# Patient Record
Sex: Female | Born: 1950 | Race: White | Hispanic: No | Marital: Married | Smoking: Never smoker
Health system: Southern US, Community
[De-identification: ages and names within clinical notes are randomized; demographics above are authoritative.]

## PROBLEM LIST (undated history)

## (undated) DIAGNOSIS — Z87442 Personal history of urinary calculi: Secondary | ICD-10-CM

## (undated) DIAGNOSIS — Z8739 Personal history of other diseases of the musculoskeletal system and connective tissue: Secondary | ICD-10-CM

## (undated) DIAGNOSIS — Z9889 Other specified postprocedural states: Secondary | ICD-10-CM

## (undated) DIAGNOSIS — M810 Age-related osteoporosis without current pathological fracture: Secondary | ICD-10-CM

## (undated) HISTORY — DX: Age-related osteoporosis without current pathological fracture: M81.0

## (undated) HISTORY — PX: BREAST SURGERY: SHX581

---

## 1969-11-02 DIAGNOSIS — Z9889 Other specified postprocedural states: Secondary | ICD-10-CM

## 1969-11-02 HISTORY — DX: Other specified postprocedural states: Z98.890

## 1998-07-29 ENCOUNTER — Other Ambulatory Visit: Admission: RE | Admit: 1998-07-29 | Discharge: 1998-07-29 | Payer: Self-pay | Admitting: Obstetrics and Gynecology

## 1999-07-23 ENCOUNTER — Other Ambulatory Visit: Admission: RE | Admit: 1999-07-23 | Discharge: 1999-07-23 | Payer: Self-pay | Admitting: Obstetrics and Gynecology

## 2000-09-29 ENCOUNTER — Other Ambulatory Visit: Admission: RE | Admit: 2000-09-29 | Discharge: 2000-09-29 | Payer: Self-pay | Admitting: *Deleted

## 2002-04-12 ENCOUNTER — Encounter: Payer: Self-pay | Admitting: Obstetrics and Gynecology

## 2002-04-12 ENCOUNTER — Ambulatory Visit (HOSPITAL_COMMUNITY): Admission: RE | Admit: 2002-04-12 | Discharge: 2002-04-12 | Payer: Self-pay | Admitting: Obstetrics and Gynecology

## 2002-04-18 ENCOUNTER — Other Ambulatory Visit: Admission: RE | Admit: 2002-04-18 | Discharge: 2002-04-18 | Payer: Self-pay | Admitting: Obstetrics and Gynecology

## 2003-04-17 ENCOUNTER — Encounter: Payer: Self-pay | Admitting: Obstetrics and Gynecology

## 2003-04-17 ENCOUNTER — Other Ambulatory Visit: Admission: RE | Admit: 2003-04-17 | Discharge: 2003-04-17 | Payer: Self-pay | Admitting: Obstetrics and Gynecology

## 2003-04-17 ENCOUNTER — Ambulatory Visit (HOSPITAL_COMMUNITY): Admission: RE | Admit: 2003-04-17 | Discharge: 2003-04-17 | Payer: Self-pay | Admitting: Obstetrics and Gynecology

## 2005-01-15 ENCOUNTER — Ambulatory Visit (HOSPITAL_COMMUNITY): Admission: RE | Admit: 2005-01-15 | Discharge: 2005-01-15 | Payer: Self-pay | Admitting: Obstetrics and Gynecology

## 2005-05-18 ENCOUNTER — Ambulatory Visit (HOSPITAL_COMMUNITY): Admission: RE | Admit: 2005-05-18 | Discharge: 2005-05-18 | Payer: Self-pay | Admitting: Family Medicine

## 2005-05-19 ENCOUNTER — Ambulatory Visit (HOSPITAL_COMMUNITY): Admission: RE | Admit: 2005-05-19 | Discharge: 2005-05-19 | Payer: Self-pay | Admitting: Family Medicine

## 2005-11-30 ENCOUNTER — Ambulatory Visit (HOSPITAL_COMMUNITY): Admission: RE | Admit: 2005-11-30 | Discharge: 2005-11-30 | Payer: Self-pay | Admitting: Internal Medicine

## 2005-11-30 ENCOUNTER — Ambulatory Visit: Payer: Self-pay | Admitting: Internal Medicine

## 2005-11-30 ENCOUNTER — Ambulatory Visit (HOSPITAL_COMMUNITY): Admission: RE | Admit: 2005-11-30 | Discharge: 2005-11-30 | Payer: Self-pay | Admitting: Obstetrics and Gynecology

## 2006-02-02 ENCOUNTER — Ambulatory Visit (HOSPITAL_COMMUNITY): Admission: RE | Admit: 2006-02-02 | Discharge: 2006-02-02 | Payer: Self-pay | Admitting: Obstetrics and Gynecology

## 2007-05-24 ENCOUNTER — Ambulatory Visit (HOSPITAL_COMMUNITY): Admission: RE | Admit: 2007-05-24 | Discharge: 2007-05-24 | Payer: Self-pay | Admitting: Obstetrics and Gynecology

## 2008-05-29 ENCOUNTER — Ambulatory Visit (HOSPITAL_COMMUNITY): Admission: RE | Admit: 2008-05-29 | Discharge: 2008-05-29 | Payer: Self-pay | Admitting: Obstetrics

## 2009-06-03 ENCOUNTER — Ambulatory Visit (HOSPITAL_COMMUNITY): Admission: RE | Admit: 2009-06-03 | Discharge: 2009-06-03 | Payer: Self-pay | Admitting: Obstetrics and Gynecology

## 2009-09-27 ENCOUNTER — Ambulatory Visit (HOSPITAL_COMMUNITY): Admission: RE | Admit: 2009-09-27 | Discharge: 2009-09-27 | Payer: Self-pay | Admitting: Internal Medicine

## 2010-06-05 ENCOUNTER — Ambulatory Visit (HOSPITAL_COMMUNITY): Admission: RE | Admit: 2010-06-05 | Discharge: 2010-06-05 | Payer: Self-pay | Admitting: Internal Medicine

## 2010-06-12 ENCOUNTER — Ambulatory Visit (HOSPITAL_COMMUNITY): Admission: RE | Admit: 2010-06-12 | Discharge: 2010-06-12 | Payer: Self-pay | Admitting: Obstetrics and Gynecology

## 2010-11-23 ENCOUNTER — Encounter: Payer: Self-pay | Admitting: Obstetrics and Gynecology

## 2011-03-20 NOTE — Op Note (Signed)
NAMEMATTIA, Cassandra Berry                  ACCOUNT NO.:  [id number]   MEDICAL RECORD NO.:  [id number]          PATIENT TYPE:  AMB   LOCATION:  DAY                           FACILITY:  [facility]   PHYSICIAN:  R. Roetta Sessions, M.D. DATE OF BIRTH:  02-10-1951   DATE OF PROCEDURE:  11/30/2005  DATE OF DISCHARGE:                                 OPERATIVE REPORT   PROCEDURE:  Screening colonoscopy.   ENDOSCOPIST:  Gerrit Friends. Rourk, M.D.   INDICATIONS FOR PROCEDURE:  The patient is a 60 year old white female  referred for colorectal cancer screening.  Referred by Dr. Richardean Sale  down in [place].  Ms. Biglow is devoid of any lower GI tract symptoms.  There is no family history of colorectal neoplasia, and she has never had  her colon imaged previously.  Colonoscopy is now being done as a standard  screening maneuver.  This approach has been discussed with the patient at  length. The potential risks, benefits, and alternatives have been reviewed;  questions answered.  She is agreeable.  Please see documentation in the  medical record.   PROCEDURE NOTE:  O2 saturation, blood pressure, pulse and respirations were  monitored throughout the entire procedure.   CONSCIOUS SEDATION:  Versed 3 mg IV, Demerol 75 mg IV in divided doses.   INSTRUMENT:  Olympus video chip system.   FINDINGS:  A digital rectal exam revealed no abnormalities.   ENDOSCOPIC FINDINGS:  The prep was excellent.   RECTUM:  Examination of the rectal mucosa including the retroflex view of  the anal verge revealed no abnormalities.   COLON:  The colonic mucosa was surveyed from the rectosigmoid junction  through the left transverse and right colon to the area of the appendiceal  orifice, ileocecal valve, and cecum.  These structures were well seen and  photographed for the record.   From this level the scope was slowly and cautiously withdrawn.  All  previously mentioned mucosal surfaces were again seen.  The colonic mucosa  appeared normal.  The patient tolerated the procedure well and was reacted  in endoscopy.   CECAL WITHDRAWAL TIME:  8 minutes.   IMPRESSION:  1.  Normal rectum.  2.  Normal colon.   RECOMMENDATIONS:  Repeat screening colonoscopy in 10 years.      Jonathon Bellows, M.D.  Electronically Signed     RMR/MEDQ  D:  11/30/2005  T:  11/30/2005  Job:  [id number]   cc:   Richardean Sale, M.D.  Fax: [phone]   Kirk Ruths, M.D.  Fax: [phone]

## 2011-05-01 ENCOUNTER — Other Ambulatory Visit (HOSPITAL_COMMUNITY): Payer: Self-pay | Admitting: Internal Medicine

## 2011-05-01 DIAGNOSIS — M79606 Pain in leg, unspecified: Secondary | ICD-10-CM

## 2011-05-05 ENCOUNTER — Ambulatory Visit (HOSPITAL_COMMUNITY)
Admission: RE | Admit: 2011-05-05 | Discharge: 2011-05-05 | Disposition: A | Discharge status: Home / Self Care | Payer: BC Managed Care – PPO | Source: Ambulatory Visit | Attending: Internal Medicine | Admitting: Internal Medicine

## 2011-05-05 DIAGNOSIS — R229 Localized swelling, mass and lump, unspecified: Secondary | ICD-10-CM | POA: Insufficient documentation

## 2011-05-05 DIAGNOSIS — M79609 Pain in unspecified limb: Secondary | ICD-10-CM | POA: Insufficient documentation

## 2011-05-05 DIAGNOSIS — M79606 Pain in leg, unspecified: Secondary | ICD-10-CM

## 2012-06-07 ENCOUNTER — Other Ambulatory Visit (HOSPITAL_COMMUNITY): Payer: Self-pay | Admitting: Obstetrics and Gynecology

## 2012-06-07 DIAGNOSIS — M810 Age-related osteoporosis without current pathological fracture: Secondary | ICD-10-CM

## 2012-06-10 ENCOUNTER — Ambulatory Visit (HOSPITAL_COMMUNITY)
Admission: RE | Admit: 2012-06-10 | Discharge: 2012-06-10 | Disposition: A | Discharge status: Home / Self Care | Payer: BC Managed Care – PPO | Source: Ambulatory Visit | Attending: Obstetrics and Gynecology | Admitting: Obstetrics and Gynecology

## 2012-06-10 DIAGNOSIS — M810 Age-related osteoporosis without current pathological fracture: Secondary | ICD-10-CM

## 2012-06-10 DIAGNOSIS — Z780 Asymptomatic menopausal state: Secondary | ICD-10-CM | POA: Insufficient documentation

## 2012-06-10 DIAGNOSIS — M818 Other osteoporosis without current pathological fracture: Secondary | ICD-10-CM | POA: Insufficient documentation

## 2012-07-15 ENCOUNTER — Encounter (HOSPITAL_COMMUNITY): Payer: BC Managed Care – PPO | Attending: Internal Medicine

## 2012-07-15 ENCOUNTER — Encounter (HOSPITAL_COMMUNITY): Payer: Self-pay

## 2012-07-15 VITALS — BP 113/72 | HR 69 | Temp 98.8°F | Resp 16

## 2012-07-15 DIAGNOSIS — M810 Age-related osteoporosis without current pathological fracture: Secondary | ICD-10-CM

## 2012-07-15 HISTORY — DX: Age-related osteoporosis without current pathological fracture: M81.0

## 2012-07-15 MED ORDER — SODIUM CHLORIDE 0.9 % IJ SOLN
10.0000 mL | INTRAMUSCULAR | Status: DC | PRN
  Administered 2012-07-15: 10 mL via INTRAVENOUS

## 2012-07-15 MED ORDER — ZOLEDRONIC ACID 5 MG/100ML IV SOLN
5.0000 mg | Freq: Once | INTRAVENOUS | Status: AC
  Administered 2012-07-15: 5 mg via INTRAVENOUS

## 2012-07-15 MED ORDER — SODIUM CHLORIDE 0.9 % IV SOLN
INTRAVENOUS | Status: DC
  Administered 2012-07-15: 15:00:00 via INTRAVENOUS

## 2012-07-15 NOTE — Progress Notes (Signed)
Tolerated reclast well.

## 2013-09-15 ENCOUNTER — Ambulatory Visit (HOSPITAL_COMMUNITY)
Admission: RE | Admit: 2013-09-15 | Discharge: 2013-09-15 | Disposition: A | Discharge status: Home / Self Care | Payer: BC Managed Care – PPO | Source: Ambulatory Visit | Attending: Internal Medicine | Admitting: Internal Medicine

## 2013-09-15 ENCOUNTER — Encounter (HOSPITAL_COMMUNITY): Payer: Self-pay

## 2013-09-15 ENCOUNTER — Other Ambulatory Visit (HOSPITAL_COMMUNITY): Payer: Self-pay | Admitting: Internal Medicine

## 2013-09-15 DIAGNOSIS — M5410 Radiculopathy, site unspecified: Secondary | ICD-10-CM

## 2013-09-15 DIAGNOSIS — M545 Low back pain, unspecified: Secondary | ICD-10-CM | POA: Insufficient documentation

## 2013-09-15 DIAGNOSIS — M5432 Sciatica, left side: Secondary | ICD-10-CM

## 2013-09-15 DIAGNOSIS — M5126 Other intervertebral disc displacement, lumbar region: Secondary | ICD-10-CM | POA: Insufficient documentation

## 2013-09-15 DIAGNOSIS — IMO0002 Reserved for concepts with insufficient information to code with codable children: Secondary | ICD-10-CM | POA: Insufficient documentation

## 2013-11-14 ENCOUNTER — Encounter (HOSPITAL_COMMUNITY)
Admission: RE | Admit: 2013-11-14 | Discharge: 2013-11-14 | Disposition: A | Discharge status: Home / Self Care | Payer: BC Managed Care – PPO | Source: Ambulatory Visit | Attending: Internal Medicine | Admitting: Internal Medicine

## 2013-11-14 DIAGNOSIS — M810 Age-related osteoporosis without current pathological fracture: Secondary | ICD-10-CM | POA: Insufficient documentation

## 2013-11-14 LAB — COMPREHENSIVE METABOLIC PANEL
ALK PHOS: 69 U/L (ref 39–117)
ALT: 12 U/L (ref 0–35)
AST: 21 U/L (ref 0–37)
Albumin: 4 g/dL (ref 3.5–5.2)
BILIRUBIN TOTAL: 0.4 mg/dL (ref 0.3–1.2)
BUN: 27 mg/dL — ABNORMAL HIGH (ref 6–23)
CALCIUM: 10.1 mg/dL (ref 8.4–10.5)
CO2: 31 mEq/L (ref 19–32)
CREATININE: 0.97 mg/dL (ref 0.50–1.10)
Chloride: 103 mEq/L (ref 96–112)
GFR calc non Af Amer: 60 mL/min — ABNORMAL LOW (ref >90)
GFR, EST AFRICAN AMERICAN: 70 mL/min — AB (ref >90)
Glucose, Bld: 83 mg/dL (ref 70–99)
POTASSIUM: 4.6 meq/L (ref 3.7–5.3)
SODIUM: 143 meq/L (ref 137–147)
Total Protein: 6.8 g/dL (ref 6.0–8.3)

## 2013-11-14 LAB — PHOSPHORUS: Phosphorus: 4 mg/dL (ref 2.3–4.6)

## 2013-11-14 LAB — MAGNESIUM: Magnesium: 2.2 mg/dL (ref 1.5–2.5)

## 2013-11-14 NOTE — Progress Notes (Signed)
Results for Cassandra, Berry (MRN [id number]) as of 11/14/2013 12:43  Ref. Range 11/14/2013 10:36  Sodium Latest Range: 137-147 mEq/L 143  Potassium Latest Range: 3.7-5.3 mEq/L 4.6  Chloride Latest Range: 96-112 mEq/L 103  CO2 Latest Range: 19-32 mEq/L 31  BUN Latest Range: 6-23 mg/dL 27 (H)  Creatinine Latest Range: 0.50-1.10 mg/dL 0.97  Calcium Latest Range: 8.4-10.5 mg/dL 10.1  GFR calc non Af Amer Latest Range: >90 mL/min 60 (L)  GFR calc Af Amer Latest Range: >90 mL/min 70 (L)  Glucose Latest Range: 70-99 mg/dL 83  Phosphorus Latest Range: 2.3-4.6 mg/dL 4.0  Magnesium Latest Range: 1.5-2.5 mg/dL 2.2  Alkaline Phosphatase Latest Range: 39-117 U/L 69  Albumin Latest Range: 3.5-5.2 g/dL 4.0  AST Latest Range: 0-37 U/L 21  ALT Latest Range: 0-35 U/L 12  Total Protein Latest Range: 6.0-8.3 g/dL 6.8  Total Bilirubin Latest Range: 0.3-1.2 mg/dL 0.4  Reclast due 11/17/2013

## 2013-11-14 NOTE — Discharge Instructions (Signed)
Osteoporosis Throughout your life, your body breaks down old bone and replaces it with new bone. As you get older, your body does not replace bone as quickly as it breaks it down. By the age of 57 years, most people begin to gradually lose bone because of the imbalance between bone loss and replacement. Some people lose more bone than others. Bone loss beyond a specified normal degree is considered osteoporosis.  Osteoporosis affects the strength and durability of your bones. The inside of the ends of your bones and your flat bones, like the bones of your pelvis, look like honeycomb, filled with tiny open spaces. As bone loss occurs, your bones become less dense. This means that the open spaces inside your bones become bigger and the walls between these spaces become thinner. This makes your bones weaker. Bones of a person with osteoporosis can become so weak that they can break (fracture) during minor accidents, such as a simple fall. CAUSES  The following factors have been associated with the development of osteoporosis:  Smoking.  Drinking more than 2 alcoholic drinks several days per week.  Long-term use of certain medicines:  Corticosteroids.  Chemotherapy medicines.  Thyroid medicines.  Antiepileptic medicines.  Gonadal hormone suppression medicine.  Immunosuppression medicine.  Being underweight.  Lack of physical activity.  Lack of exposure to the sun. This can lead to vitamin D deficiency.  Certain medical conditions:  Certain inflammatory bowel diseases, such as Crohn disease and ulcerative colitis.  Diabetes.  Hyperthyroidism.  Hyperparathyroidism. RISK FACTORS Anyone can develop osteoporosis. However, the following factors can increase your risk of developing osteoporosis:  Gender Women are at higher risk than men.  Age Being older than 38 years increases your risk.  Ethnicity White and Asian people have an increased risk.  Weight Being extremely  underweight can increase your risk of osteoporosis.  Family history of osteoporosis Having a family member who has developed osteoporosis can increase your risk. SYMPTOMS  Usually, people with osteoporosis have no symptoms.  DIAGNOSIS  Signs during a physical exam that may prompt your caregiver to suspect osteoporosis include:  Decreased height. This is usually caused by the compression of the bones that form your spine (vertebrae) because they have weakened and become fractured.  A curving or rounding of the upper back (kyphosis). To confirm signs of osteoporosis, your caregiver may request a procedure that uses 2 low-dose X-ray beams with different levels of energy to measure your bone mineral density (dual-energy X-ray absorptiometry [DXA]). Also, your caregiver may check your level of vitamin D. TREATMENT  The goal of osteoporosis treatment is to strengthen bones in order to decrease the risk of bone fractures. There are different types of medicines available to help achieve this goal. Some of these medicines work by slowing the processes of bone loss. Some medicines work by increasing bone density. Treatment also involves making sure that your levels of calcium and vitamin D are adequate. PREVENTION  There are things you can do to help prevent osteoporosis. Adequate intake of calcium and vitamin D can help you achieve optimal bone mineral density. Regular exercise can also help, especially resistance and weight-bearing activities. If you smoke, quitting smoking is an important part of osteoporosis prevention. MAKE SURE YOU:  Understand these instructions.  Will watch your condition.  Will get help right away if you are not doing well or get worse. FOR MORE INFORMATION www.osteo.org and EquipmentWeekly.com.ee Document Released: 07/29/2005 Document Revised: 02/13/2013 Document Reviewed: 10/03/2011 [facility] Patient Information 2014 [place], [place].  Zoledronic Acid injection (Paget's Disease,  Osteoporosis) What is this medicine? ZOLEDRONIC ACID (ZOE le dron ik AS id) lowers the amount of calcium loss from bone. It is used to treat Paget's disease and osteoporosis in women. This medicine may be used for other purposes; ask your health care provider or pharmacist if you have questions. COMMON BRAND NAME(S): Reclast, Zometa What should I tell my health care provider before I take this medicine? They need to know if you have any of these conditions: -aspirin-sensitive asthma -cancer, especially if you are receiving medicines used to treat cancer -dental disease or wear dentures -infection -kidney disease -low levels of calcium in the blood -past surgery on the parathyroid gland or intestines -receiving corticosteroids like dexamethasone or prednisone -an unusual or allergic reaction to zoledronic acid, other medicines, foods, dyes, or preservatives -pregnant or trying to get pregnant -breast-feeding How should I use this medicine? This medicine is for infusion into a vein. It is given by a health care professional in a hospital or clinic setting. Talk to your pediatrician regarding the use of this medicine in children. This medicine is not approved for use in children. Overdosage: If you think you have taken too much of this medicine contact a poison control center or emergency room at once. NOTE: This medicine is only for you. Do not share this medicine with others. What if I miss a dose? It is important not to miss your dose. Call your doctor or health care professional if you are unable to keep an appointment. What may interact with this medicine? -certain antibiotics given by injection -NSAIDs, medicines for pain and inflammation, like ibuprofen or naproxen -some diuretics like bumetanide, furosemide -teriparatide This list may not describe all possible interactions. Give your health care provider a list of all the medicines, herbs, non-prescription drugs, or dietary  supplements you use. Also tell them if you smoke, drink alcohol, or use illegal drugs. Some items may interact with your medicine. What should I watch for while using this medicine? Visit your doctor or health care professional for regular checkups. It may be some time before you see the benefit from this medicine. Do not stop taking your medicine unless your doctor tells you to. Your doctor may order blood tests or other tests to see how you are doing. Women should inform their doctor if they wish to become pregnant or think they might be pregnant. There is a potential for serious side effects to an unborn child. Talk to your health care professional or pharmacist for more information. You should make sure that you get enough calcium and vitamin D while you are taking this medicine. Discuss the foods you eat and the vitamins you take with your health care professional. Some people who take this medicine have severe bone, joint, and/or muscle pain. This medicine may also increase your risk for jaw problems or a broken thigh bone. Tell your doctor right away if you have severe pain in your jaw, bones, joints, or muscles. Tell your doctor if you have any pain that does not go away or that gets worse. Tell your dentist and dental surgeon that you are taking this medicine. You should not have major dental surgery while on this medicine. See your dentist to have a dental exam and fix any dental problems before starting this medicine. Take good care of your teeth while on this medicine. Make sure you see your dentist for regular follow-up appointments. What side effects may I notice from receiving this  medicine? Side effects that you should report to your doctor or health care professional as soon as possible: -allergic reactions like skin rash, itching or hives, swelling of the face, lips, or tongue -anxiety, confusion, or depression -breathing problems -changes in vision -eye pain -feeling faint or  lightheaded, falls -jaw pain, especially after dental work -mouth sores -muscle cramps, stiffness, or weakness -trouble passing urine or change in the amount of urine Side effects that usually do not require medical attention (report to your doctor or health care professional if they continue or are bothersome): -bone, joint, or muscle pain -constipation -diarrhea -fever -hair loss -irritation at site where injected -loss of appetite -nausea, vomiting -stomach upset -trouble sleeping -trouble swallowing -weak or tired This list may not describe all possible side effects. Call your doctor for medical advice about side effects. You may report side effects to FDA at 1-800-FDA-1088. Where should I keep my medicine? This drug is given in a hospital or clinic and will not be stored at home. NOTE: This sheet is a summary. It may not cover all possible information. If you have questions about this medicine, talk to your doctor, pharmacist, or health care provider.  2014, Elsevier/Gold Standard. (2013-04-03 10:03:48)

## 2013-11-17 ENCOUNTER — Encounter (HOSPITAL_COMMUNITY)
Admission: RE | Admit: 2013-11-17 | Discharge: 2013-11-17 | Disposition: A | Discharge status: Home / Self Care | Payer: BC Managed Care – PPO | Source: Ambulatory Visit | Attending: Internal Medicine | Admitting: Internal Medicine

## 2013-11-17 MED ORDER — ZOLEDRONIC ACID 5 MG/100ML IV SOLN
INTRAVENOUS | Status: AC
  Filled 2013-11-17: qty 100

## 2013-11-17 MED ORDER — SODIUM CHLORIDE 0.9 % IV SOLN
INTRAVENOUS | Status: DC
  Administered 2013-11-17: 10:00:00 via INTRAVENOUS

## 2013-11-17 MED ORDER — ZOLEDRONIC ACID 5 MG/100ML IV SOLN
5.0000 mg | Freq: Once | INTRAVENOUS | Status: AC
  Administered 2013-11-17: 5 mg via INTRAVENOUS

## 2014-04-05 ENCOUNTER — Other Ambulatory Visit (HOSPITAL_COMMUNITY): Payer: Self-pay | Admitting: Obstetrics and Gynecology

## 2014-04-05 DIAGNOSIS — Z139 Encounter for screening, unspecified: Secondary | ICD-10-CM

## 2014-04-10 ENCOUNTER — Ambulatory Visit (HOSPITAL_COMMUNITY)
Admission: RE | Admit: 2014-04-10 | Discharge: 2014-04-10 | Disposition: A | Discharge status: Home / Self Care | Payer: BC Managed Care – PPO | Source: Ambulatory Visit | Attending: Obstetrics and Gynecology | Admitting: Obstetrics and Gynecology

## 2014-04-10 DIAGNOSIS — Z1231 Encounter for screening mammogram for malignant neoplasm of breast: Secondary | ICD-10-CM | POA: Insufficient documentation

## 2014-04-10 DIAGNOSIS — Z139 Encounter for screening, unspecified: Secondary | ICD-10-CM

## 2014-04-27 ENCOUNTER — Other Ambulatory Visit (HOSPITAL_COMMUNITY): Payer: Self-pay | Admitting: Obstetrics and Gynecology

## 2014-04-27 DIAGNOSIS — M810 Age-related osteoporosis without current pathological fracture: Secondary | ICD-10-CM

## 2014-05-08 ENCOUNTER — Ambulatory Visit (HOSPITAL_COMMUNITY)
Admission: RE | Admit: 2014-05-08 | Discharge: 2014-05-08 | Disposition: A | Discharge status: Home / Self Care | Payer: BC Managed Care – PPO | Source: Ambulatory Visit | Attending: Obstetrics and Gynecology | Admitting: Obstetrics and Gynecology

## 2014-05-08 DIAGNOSIS — M810 Age-related osteoporosis without current pathological fracture: Secondary | ICD-10-CM

## 2014-11-16 ENCOUNTER — Encounter (HOSPITAL_COMMUNITY)
Admission: RE | Admit: 2014-11-16 | Discharge: 2014-11-16 | Disposition: A | Discharge status: Home / Self Care | Payer: Medicare Other | Source: Ambulatory Visit | Attending: Internal Medicine | Admitting: Internal Medicine

## 2014-11-16 ENCOUNTER — Encounter (HOSPITAL_COMMUNITY): Payer: Self-pay

## 2014-11-16 ENCOUNTER — Encounter (HOSPITAL_COMMUNITY): Payer: BC Managed Care – PPO

## 2014-11-16 DIAGNOSIS — M810 Age-related osteoporosis without current pathological fracture: Secondary | ICD-10-CM | POA: Diagnosis present

## 2014-11-16 HISTORY — DX: Other specified postprocedural states: Z98.890

## 2014-11-16 LAB — COMPREHENSIVE METABOLIC PANEL
ALBUMIN: 4.4 g/dL (ref 3.5–5.2)
ALK PHOS: 70 U/L (ref 39–117)
ALT: 19 U/L (ref 0–35)
AST: 21 U/L (ref 0–37)
Anion gap: 5 (ref 5–15)
BILIRUBIN TOTAL: 0.4 mg/dL (ref 0.3–1.2)
BUN: 25 mg/dL — ABNORMAL HIGH (ref 6–23)
CALCIUM: 9.6 mg/dL (ref 8.4–10.5)
CHLORIDE: 104 meq/L (ref 96–112)
CO2: 29 mmol/L (ref 19–32)
Creatinine, Ser: 0.74 mg/dL (ref 0.50–1.10)
GFR calc non Af Amer: 87 mL/min — ABNORMAL LOW (ref >90)
Glucose, Bld: 104 mg/dL — ABNORMAL HIGH (ref 70–99)
Potassium: 4 mmol/L (ref 3.5–5.1)
SODIUM: 138 mmol/L (ref 135–145)
TOTAL PROTEIN: 7 g/dL (ref 6.0–8.3)

## 2014-11-16 LAB — PHOSPHORUS: Phosphorus: 3.6 mg/dL (ref 2.3–4.6)

## 2014-11-16 LAB — MAGNESIUM: MAGNESIUM: 2.1 mg/dL (ref 1.5–2.5)

## 2014-11-16 MED ORDER — SODIUM CHLORIDE 0.9 % IV SOLN
Freq: Once | INTRAVENOUS | Status: AC
  Administered 2014-11-16: 250 mL via INTRAVENOUS

## 2014-11-16 MED ORDER — ZOLEDRONIC ACID 5 MG/100ML IV SOLN
5.0000 mg | Freq: Once | INTRAVENOUS | Status: AC
  Administered 2014-11-16: 5 mg via INTRAVENOUS

## 2014-11-16 MED ORDER — ZOLEDRONIC ACID 5 MG/100ML IV SOLN
INTRAVENOUS | Status: AC
  Filled 2014-11-16: qty 100

## 2014-11-16 NOTE — Discharge Instructions (Signed)

## 2014-11-16 NOTE — Progress Notes (Signed)
Results for Cassandra Berry, Cassandra Berry (MRN [id number]) as of 11/16/2014 13:49  Labs prior to Reclast infusion. Tolerated well. Appointment made 11/18/2015  Ref. Range 11/16/2014 13:15  Sodium Latest Range: 135-145 mmol/L 138  Potassium Latest Range: 3.5-5.1 mmol/L 4.0  Chloride Latest Range: 96-112 mEq/L 104  CO2 Latest Range: 19-32 mmol/L 29  BUN Latest Range: 6-23 mg/dL 25 (H)  Creatinine Latest Range: 0.50-1.10 mg/dL 0.74  Calcium Latest Range: 8.4-10.5 mg/dL 9.6  GFR calc non Af Amer Latest Range: >90 mL/min 87 (L)  GFR calc Af Amer Latest Range: >90 mL/min >90  Glucose Latest Range: 70-99 mg/dL 104 (H)  Anion gap Latest Range: 5-15  5  Phosphorus Latest Range: 2.3-4.6 mg/dL 3.6  Magnesium Latest Range: 1.5-2.5 mg/dL 2.1  Alkaline Phosphatase Latest Range: 39-117 U/L 70  Albumin Latest Range: 3.5-5.2 g/dL 4.4  AST Latest Range: 0-37 U/L 21  ALT Latest Range: 0-35 U/L 19  Total Protein Latest Range: 6.0-8.3 g/dL 7.0  Total Bilirubin Latest Range: 0.3-1.2 mg/dL 0.4

## 2015-03-15 ENCOUNTER — Other Ambulatory Visit (HOSPITAL_COMMUNITY): Payer: Self-pay | Admitting: Internal Medicine

## 2015-03-15 DIAGNOSIS — R109 Unspecified abdominal pain: Secondary | ICD-10-CM

## 2015-03-20 ENCOUNTER — Other Ambulatory Visit (HOSPITAL_COMMUNITY): Payer: BC Managed Care – PPO

## 2015-03-28 ENCOUNTER — Ambulatory Visit (HOSPITAL_COMMUNITY): Admission: RE | Admit: 2015-03-28 | Payer: Medicare Other | Source: Ambulatory Visit

## 2015-03-29 ENCOUNTER — Ambulatory Visit (HOSPITAL_COMMUNITY)
Admission: RE | Admit: 2015-03-29 | Discharge: 2015-03-29 | Disposition: A | Discharge status: Home / Self Care | Payer: Medicare Other | Source: Ambulatory Visit | Attending: Internal Medicine | Admitting: Internal Medicine

## 2015-03-29 ENCOUNTER — Ambulatory Visit (HOSPITAL_COMMUNITY): Payer: Medicare Other

## 2015-03-29 DIAGNOSIS — Z87442 Personal history of urinary calculi: Secondary | ICD-10-CM | POA: Insufficient documentation

## 2015-03-29 DIAGNOSIS — R109 Unspecified abdominal pain: Secondary | ICD-10-CM | POA: Insufficient documentation

## 2015-04-16 ENCOUNTER — Ambulatory Visit: Payer: Medicare Other | Admitting: Orthopedic Surgery

## 2015-04-22 ENCOUNTER — Encounter: Payer: Self-pay | Admitting: Orthopedic Surgery

## 2015-04-22 ENCOUNTER — Ambulatory Visit (INDEPENDENT_AMBULATORY_CARE_PROVIDER_SITE_OTHER): Payer: Medicare Other

## 2015-04-22 ENCOUNTER — Ambulatory Visit (INDEPENDENT_AMBULATORY_CARE_PROVIDER_SITE_OTHER): Payer: Medicare Other | Admitting: Orthopedic Surgery

## 2015-04-22 VITALS — BP 111/63 | Ht 60.5 in | Wt 120.0 lb

## 2015-04-22 DIAGNOSIS — M25561 Pain in right knee: Secondary | ICD-10-CM

## 2015-04-22 DIAGNOSIS — M1120 Other chondrocalcinosis, unspecified site: Secondary | ICD-10-CM

## 2015-04-22 DIAGNOSIS — M1711 Unilateral primary osteoarthritis, right knee: Secondary | ICD-10-CM

## 2015-04-22 MED ORDER — DICLOFENAC POTASSIUM 50 MG PO TABS: 50.0000 mg | ORAL_TABLET | Freq: Two times a day (BID) | ORAL | Status: DC

## 2015-04-22 NOTE — Progress Notes (Signed)
Patient ID: Cassandra Berry, female   DOB: [birth year], 64 y.o.   MRN: [id number] Patient ID: Cassandra Berry, female   DOB: 12/19/1950, 64 y.o.   MRN: [id number]  Chief Complaint  Patient presents with  . Follow-up    right knee pain and swelling x 6 months, no known injury    HPI [name] is a 64 y.o. female.  This patient complains of intermittent pain in her right knee that comes and goes for the last 6 months. Pain swelling and it comes its 9 out of 10 at sharp it seems to be worse at night. She did get some improvement with diclofenac and ibuprofen and even better with prednisone. She denies any giving way symptoms reports that occasionally may feel a little weak or like it might give out  Night sweats joint pain gait problem stiff joints swollen joints were reported as positive findings under review of systems the other systems were reported and marked as normal   Past Medical History  Diagnosis Date  . Osteoporosis 07/15/2012  . History of lumpectomy of right breast 1971  . Chronic kidney disease     history of kidney stone    History reviewed. Surgical history lumpectomy and 2 pregnancies one in 1975 1 in [place] history of fractures and osteoporosis otherwise normal   No Known Allergies  Current Outpatient Prescriptions  Medication Sig Dispense Refill  . Calcium Carbonate-Vitamin D (CALCIUM + D PO) Take 1 tablet by mouth daily.    . diclofenac (CATAFLAM) 50 MG tablet Take 1 tablet (50 mg total) by mouth 2 (two) times daily. 60 tablet 1  . ferrous sulfate 325 (65 FE) MG tablet Take 325 mg by mouth daily with breakfast.    . glucosamine-chondroitin 500-400 MG tablet Take 1 tablet by mouth daily.    . Multiple Vitamin (MULTIVITAMIN) tablet Take 1 tablet by mouth daily.    . Omega 3-6-9 Fatty Acids (OMEGA 3-6-9 COMPLEX PO) Take 1 capsule by mouth daily.     No current facility-administered medications for this visit.    Review of Systems Review of Systems  See  above  Physical Exam Blood pressure 111/63, height 5' 0.5" (1.537 m), weight 120 lb (54.432 kg).  Physical Exam  Data Reviewed Imaging studies show she has some mild arthritis on the medial side of the knee  Assessment    I don't appreciate a meniscal tear because she has a negative McMurray's no real mechanical symptoms mainly pain is intermittent and is relieved by anti-inflammatory  Encounter Diagnoses  Name Primary?  . Right knee pain Yes  . Chondrocalcinosis   . Primary osteoarthritis of knee, right         Plan    Continue the anti-inflammatory diclofenac, inject the knee for chondrocalcinosis. We scheduled her for 2 month follow-up but if she improves then we can certainly cancel the appointment  Procedure note right knee injection verbal consent was obtained to inject right knee joint  Timeout was completed to confirm the site of injection  The medications used were 40 mg of Depo-Medrol and 1% lidocaine 3 cc  Anesthesia was provided by ethyl chloride and the skin was prepped with alcohol.  After cleaning the skin with alcohol a 20-gauge needle was used to inject the right knee joint. There were no complications. A sterile bandage was applied.         [name] 04/22/2015, 4:08 PM

## 2015-04-22 NOTE — Patient Instructions (Signed)
Joint Injection  Care After  Refer to this sheet in the next few days. These instructions provide you with information on caring for yourself after you have had a joint injection. Your caregiver also may give you more specific instructions. Your treatment has been planned according to current medical practices, but problems sometimes occur. Call your caregiver if you have any problems or questions after your procedure.  After any type of joint injection, it is not uncommon to experience:  · Soreness, swelling, or bruising around the injection site.  · Mild numbness, tingling, or weakness around the injection site caused by the numbing medicine used before or with the injection.  It also is possible to experience the following effects associated with the specific agent after injection:  · Iodine-based contrast agents:  ¨ Allergic reaction (itching, hives, widespread redness, and swelling beyond the injection site).  · Corticosteroids (These effects are rare.):  ¨ Allergic reaction.  ¨ Increased blood sugar levels (If you have diabetes and you notice that your blood sugar levels have increased, notify your caregiver).  ¨ Increased blood pressure levels.  ¨ Mood swings.  · Hyaluronic acid in the use of viscosupplementation.  ¨ Temporary heat or redness.  ¨ Temporary rash and itching.  ¨ Increased fluid accumulation in the injected joint.  These effects all should resolve within a day after your procedure.   HOME CARE INSTRUCTIONS  · Limit yourself to light activity the day of your procedure. Avoid lifting heavy objects, bending, stooping, or twisting.  · Take prescription or over-the-counter pain medication as directed by your caregiver.  · You may apply ice to your injection site to reduce pain and swelling the day of your procedure. Ice may be applied 03-04 times:  ¨ Put ice in a plastic bag.  ¨ Place a towel between your skin and the bag.  ¨ Leave the ice on for no longer than 15-20 minutes each time.  SEEK  IMMEDIATE MEDICAL CARE IF:   · Pain and swelling get worse rather than better or extend beyond the injection site.  · Numbness does not go away.  · Blood or fluid continues to leak from the injection site.  · You have chest pain.  · You have swelling of your face or tongue.  · You have trouble breathing or you become dizzy.  · You develop a fever, chills, or severe tenderness at the injection site that last longer than 1 day.  MAKE SURE YOU:  · Understand these instructions.  · Watch your condition.  · Get help right away if you are not doing well or if you get worse.  Document Released: 07/02/2011 Document Revised: 01/11/2012 Document Reviewed: 07/02/2011  ExitCare® Patient Information ©2015 [employer]. This information is not intended to replace advice given to you by your health care provider. Make sure you discuss any questions you have with your health care provider.

## 2015-05-10 ENCOUNTER — Other Ambulatory Visit (HOSPITAL_COMMUNITY): Payer: Medicare Other

## 2015-05-10 NOTE — Patient Instructions (Signed)
    Cassandra Berry  05/10/2015     Your procedure is scheduled on 05/15/15.  Report to [facility] at 07:00 A.M.  Call this number if you have problems the morning of surgery:  [phone]   Remember:  Do not eat food or drink liquids after midnight.  Take these medicines the morning of surgery with A SIP OF WATER: None   Do not wear jewelry, make-up or nail polish.  Do not wear lotions, powders, or perfumes.  You may wear deodorant.  Do not shave 48 hours prior to surgery.  Men may shave face and neck.  Do not bring valuables to the hospital.  [facility] is not responsible for any belongings or valuables.  Contacts, dentures or bridgework may not be worn into surgery.  Leave your suitcase in the car.  After surgery it may be brought to your room.  For patients admitted to the hospital, discharge time will be determined by your treatment team.  Patients discharged the day of surgery will not be allowed to drive home.   Special instructions:  Shower using CHG (Hibiclens bath) the night before surgery and the morning of surgery.  Please read over the following fact sheets that you were given. Anesthesia Post-op Instructions   PATIENT INSTRUCTIONS POST-ANESTHESIA  IMMEDIATELY FOLLOWING SURGERY:  Do not drive or operate machinery for the first twenty four hours after surgery.  Do not make any important decisions for twenty four hours after surgery or while taking narcotic pain medications or sedatives.  If you develop intractable nausea and vomiting or a severe headache please notify your doctor immediately.  FOLLOW-UP:  Please make an appointment with your surgeon as instructed. You do not need to follow up with anesthesia unless specifically instructed to do so.  WOUND CARE INSTRUCTIONS (if applicable):  Keep a dry clean dressing on the anesthesia/puncture wound site if there is drainage.  Once the wound has quit draining you may leave it open to air.  Generally you should leave the  bandage intact for twenty four hours unless there is drainage.  If the epidural site drains for more than 36-48 hours please call the anesthesia department.  QUESTIONS?:  Please feel free to call your physician or the hospital operator if you have any questions, and they will be happy to assist you.

## 2015-05-13 ENCOUNTER — Encounter (HOSPITAL_COMMUNITY): Payer: Self-pay

## 2015-05-13 ENCOUNTER — Other Ambulatory Visit: Payer: Self-pay

## 2015-05-13 ENCOUNTER — Encounter (HOSPITAL_COMMUNITY)
Admission: RE | Admit: 2015-05-13 | Discharge: 2015-05-13 | Disposition: A | Discharge status: Home / Self Care | Payer: Medicare Other | Source: Ambulatory Visit | Attending: General Surgery | Admitting: General Surgery

## 2015-05-13 DIAGNOSIS — Z87442 Personal history of urinary calculi: Secondary | ICD-10-CM | POA: Diagnosis not present

## 2015-05-13 DIAGNOSIS — R2242 Localized swelling, mass and lump, left lower limb: Secondary | ICD-10-CM | POA: Diagnosis present

## 2015-05-13 DIAGNOSIS — D3613 Benign neoplasm of peripheral nerves and autonomic nervous system of lower limb, including hip: Secondary | ICD-10-CM | POA: Diagnosis not present

## 2015-05-13 HISTORY — DX: Personal history of other diseases of the musculoskeletal system and connective tissue: Z87.39

## 2015-05-13 HISTORY — DX: Personal history of urinary calculi: Z87.442

## 2015-05-13 LAB — CBC WITH DIFFERENTIAL/PLATELET
Basophils Absolute: 0 10*3/uL (ref 0.0–0.1)
Basophils Relative: 1 % (ref 0–1)
Eosinophils Absolute: 0.1 10*3/uL (ref 0.0–0.7)
Eosinophils Relative: 1 % (ref 0–5)
HCT: 34.1 % — ABNORMAL LOW (ref 36.0–46.0)
HEMOGLOBIN: 11.4 g/dL — AB (ref 12.0–15.0)
Lymphocytes Relative: 26 % (ref 12–46)
Lymphs Abs: 1.4 10*3/uL (ref 0.7–4.0)
MCH: 32.1 pg (ref 26.0–34.0)
MCHC: 33.4 g/dL (ref 30.0–36.0)
MCV: 96.1 fL (ref 78.0–100.0)
Monocytes Absolute: 0.2 10*3/uL (ref 0.1–1.0)
Monocytes Relative: 5 % (ref 3–12)
NEUTROS ABS: 3.5 10*3/uL (ref 1.7–7.7)
Neutrophils Relative %: 67 % (ref 43–77)
PLATELETS: 218 10*3/uL (ref 150–400)
RBC: 3.55 MIL/uL — ABNORMAL LOW (ref 3.87–5.11)
RDW: 13.1 % (ref 11.5–15.5)
WBC: 5.2 10*3/uL (ref 4.0–10.5)

## 2015-05-13 LAB — BASIC METABOLIC PANEL
Anion gap: 6 (ref 5–15)
BUN: 25 mg/dL — ABNORMAL HIGH (ref 6–20)
CHLORIDE: 106 mmol/L (ref 101–111)
CO2: 27 mmol/L (ref 22–32)
Calcium: 9.2 mg/dL (ref 8.9–10.3)
Creatinine, Ser: 0.68 mg/dL (ref 0.44–1.00)
GLUCOSE: 100 mg/dL — AB (ref 65–99)
POTASSIUM: 4.2 mmol/L (ref 3.5–5.1)
Sodium: 139 mmol/L (ref 135–145)

## 2015-05-13 NOTE — Pre-Procedure Instructions (Signed)
Patient given information to sign up for my chart at home. 

## 2015-05-14 NOTE — H&P (Signed)
  NTS SOAP Note  Vital Signs:  Vitals as of: 02/15/6062: Systolic 016: Diastolic 70: Heart Rate 80: Temp 97.13F: Height 41ft 2in: Weight 121Lbs 0 Ounces: BMI 22.13  BMI : 22.13 kg/m2  Subjective: This 64 year old female presents for of a soft tissue mass.  Has been present on the left thigh for several years, but recently has increased in size and tender to touch.  Review of Symptoms:  Constitutional:unremarkable   Head:unremarkable Eyes:unremarkable   Nose/Mouth/Throat:unremarkable Cardiovascular:  unremarkable Respiratory:unremarkable Gastrointestinal:  unremarkable   Genitourinary:unremarkable   joint pain Skin:unremarkable Hematolgic/Lymphatic:unremarkable   Allergic/Immunologic:unremarkable   Past Medical History:  Reviewed  Past Medical History  Surgical History: lumpectomy, kidney stones Allergies: nkda Medications: diclofenac   Social History:Reviewed  Social History  Preferred Language: English Race:  White Ethnicity: Not Hispanic / Latino Age: 64 year Marital Status:  M Alcohol: socially   Smoking Status: Never smoker reviewed on 04/16/2015 Functional Status reviewed on 04/16/2015 ------------------------------------------------ Bathing: Normal Cooking: Normal Dressing: Normal Driving: Normal Eating: Normal Managing Meds: Normal Oral Care: Normal Shopping: Normal Toileting: Normal Transferring: Normal Walking: Normal Cognitive Status reviewed on 04/16/2015 ------------------------------------------------ Attention: Normal Decision Making: Normal Language: Normal Memory: Normal Motor: Normal Perception: Normal Problem Solving: Normal Visual and Spatial: Normal   Family History:Reviewed  Family Health History Mother, Living; Stroke (CVA);  Father, Living; Heart disease;     Objective Information: General:Well appearing, well nourished in no distress. 5cm ovoid tender soft tissue mass in left  thigh Heart:RRR, no murmur Lungs:  CTA bilaterally, no wheezes, rhonchi, rales.  Breathing unlabored.  Assessment:Soft tissue neoplasm, left thigh  Diagnoses: 239.2  D49.2 Neoplasm of soft tissue (Neoplasm of unspecified behavior of bone, soft tissue, and skin)  Procedures: 01093 - OFFICE OUTPATIENT NEW 30 MINUTES    Plan:  Will call to schedule excision of soft tissue neoplasm, left thigh.   Patient Education:Alternative treatments to surgery were discussed with patient (and family).  Risks and benefits  of procedure were fully explained to the patient (and family) who gave informed consent. Patient/family questions were addressed.  Follow-up:Pending Surgery

## 2015-05-15 ENCOUNTER — Ambulatory Visit (HOSPITAL_COMMUNITY): Payer: Medicare Other | Admitting: Anesthesiology

## 2015-05-15 ENCOUNTER — Encounter (HOSPITAL_COMMUNITY)
Admission: RE | Disposition: A | Discharge status: Home / Self Care | Payer: Self-pay | Source: Ambulatory Visit | Attending: General Surgery

## 2015-05-15 ENCOUNTER — Ambulatory Visit (HOSPITAL_COMMUNITY)
Admission: RE | Admit: 2015-05-15 | Discharge: 2015-05-15 | Disposition: A | Discharge status: Home / Self Care | Payer: Medicare Other | Source: Ambulatory Visit | Attending: General Surgery | Admitting: General Surgery

## 2015-05-15 DIAGNOSIS — Z87442 Personal history of urinary calculi: Secondary | ICD-10-CM | POA: Insufficient documentation

## 2015-05-15 DIAGNOSIS — D3613 Benign neoplasm of peripheral nerves and autonomic nervous system of lower limb, including hip: Secondary | ICD-10-CM | POA: Diagnosis not present

## 2015-05-15 HISTORY — PX: MASS EXCISION: SHX2000

## 2015-05-15 SURGERY — EXCISION MASS
Anesthesia: General | Site: Thigh | Laterality: Left

## 2015-05-15 MED ORDER — KETOROLAC TROMETHAMINE 30 MG/ML IJ SOLN
30.0000 mg | Freq: Once | INTRAMUSCULAR | Status: AC
  Administered 2015-05-15: 30 mg via INTRAVENOUS
  Filled 2015-05-15: qty 1

## 2015-05-15 MED ORDER — ONDANSETRON HCL 4 MG/2ML IJ SOLN: 4.0000 mg | Freq: Once | INTRAMUSCULAR | Status: DC | PRN

## 2015-05-15 MED ORDER — 0.9 % SODIUM CHLORIDE (POUR BTL) OPTIME
TOPICAL | Status: DC | PRN
  Administered 2015-05-15: 1000 mL

## 2015-05-15 MED ORDER — BUPIVACAINE HCL (PF) 0.5 % IJ SOLN
INTRAMUSCULAR | Status: DC | PRN
  Administered 2015-05-15: 8 mL

## 2015-05-15 MED ORDER — SODIUM CHLORIDE 0.9 % IJ SOLN
INTRAMUSCULAR | Status: AC
  Filled 2015-05-15: qty 10

## 2015-05-15 MED ORDER — CHLORHEXIDINE GLUCONATE 4 % EX LIQD
1.0000 | Freq: Once | CUTANEOUS | Status: DC
Start: 2015-05-15 — End: 2015-05-15

## 2015-05-15 MED ORDER — POVIDONE-IODINE 10 % EX OINT
TOPICAL_OINTMENT | CUTANEOUS | Status: AC
  Filled 2015-05-15: qty 1

## 2015-05-15 MED ORDER — HYDROCODONE-ACETAMINOPHEN 5-325 MG PO TABS: 1.0000 | ORAL_TABLET | ORAL | Status: AC | PRN

## 2015-05-15 MED ORDER — MIDAZOLAM HCL 2 MG/2ML IJ SOLN
INTRAMUSCULAR | Status: AC
  Filled 2015-05-15: qty 2

## 2015-05-15 MED ORDER — LACTATED RINGERS IV SOLN
INTRAVENOUS | Status: DC
  Administered 2015-05-15: 1000 mL via INTRAVENOUS

## 2015-05-15 MED ORDER — LIDOCAINE HCL (CARDIAC) 10 MG/ML IV SOLN
INTRAVENOUS | Status: DC | PRN
  Administered 2015-05-15: 50 mg via INTRAVENOUS

## 2015-05-15 MED ORDER — FENTANYL CITRATE (PF) 100 MCG/2ML IJ SOLN
INTRAMUSCULAR | Status: DC | PRN
  Administered 2015-05-15 (×2): 50 ug via INTRAVENOUS

## 2015-05-15 MED ORDER — FENTANYL CITRATE (PF) 100 MCG/2ML IJ SOLN
25.0000 ug | INTRAMUSCULAR | Status: AC
  Administered 2015-05-15 (×2): 25 ug via INTRAVENOUS

## 2015-05-15 MED ORDER — MIDAZOLAM HCL 2 MG/2ML IJ SOLN
1.0000 mg | INTRAMUSCULAR | Status: DC | PRN
  Administered 2015-05-15: 2 mg via INTRAVENOUS

## 2015-05-15 MED ORDER — BUPIVACAINE HCL (PF) 0.5 % IJ SOLN
INTRAMUSCULAR | Status: AC
  Filled 2015-05-15: qty 30

## 2015-05-15 MED ORDER — FENTANYL CITRATE (PF) 100 MCG/2ML IJ SOLN: 25.0000 ug | INTRAMUSCULAR | Status: DC | PRN

## 2015-05-15 MED ORDER — EPHEDRINE SULFATE 50 MG/ML IJ SOLN
INTRAMUSCULAR | Status: DC | PRN
  Administered 2015-05-15: 5 mg via INTRAVENOUS

## 2015-05-15 MED ORDER — EPHEDRINE SULFATE 50 MG/ML IJ SOLN
INTRAMUSCULAR | Status: AC
  Filled 2015-05-15: qty 1

## 2015-05-15 MED ORDER — ONDANSETRON HCL 4 MG/2ML IJ SOLN
4.0000 mg | Freq: Once | INTRAMUSCULAR | Status: AC
  Administered 2015-05-15: 4 mg via INTRAVENOUS

## 2015-05-15 MED ORDER — FENTANYL CITRATE (PF) 100 MCG/2ML IJ SOLN
INTRAMUSCULAR | Status: AC
  Filled 2015-05-15: qty 2

## 2015-05-15 MED ORDER — PROPOFOL 10 MG/ML IV BOLUS
INTRAVENOUS | Status: DC | PRN
  Administered 2015-05-15: 150 mg via INTRAVENOUS

## 2015-05-15 MED ORDER — PROPOFOL 10 MG/ML IV BOLUS
INTRAVENOUS | Status: AC
  Filled 2015-05-15: qty 20

## 2015-05-15 MED ORDER — FENTANYL CITRATE (PF) 100 MCG/2ML IJ SOLN
INTRAMUSCULAR | Status: AC
Start: 2015-05-15 — End: 2015-05-15
  Filled 2015-05-15: qty 2

## 2015-05-15 MED ORDER — LIDOCAINE HCL (PF) 1 % IJ SOLN
INTRAMUSCULAR | Status: AC
  Filled 2015-05-15: qty 5

## 2015-05-15 MED ORDER — ONDANSETRON HCL 4 MG/2ML IJ SOLN
INTRAMUSCULAR | Status: AC
  Filled 2015-05-15: qty 2

## 2015-05-15 SURGICAL SUPPLY — 28 items
BAG HAMPER (MISCELLANEOUS) ×3 IMPLANT
CHLORAPREP W/TINT 10.5 ML (MISCELLANEOUS) ×3 IMPLANT
CLOTH BEACON ORANGE TIMEOUT ST (SAFETY) ×3 IMPLANT
COVER LIGHT HANDLE STERIS (MISCELLANEOUS) ×6 IMPLANT
DECANTER SPIKE VIAL GLASS SM (MISCELLANEOUS) ×3 IMPLANT
ELECT NDL TIP 2.8 STRL (NEEDLE) IMPLANT
ELECT NEEDLE TIP 2.8 STRL (NEEDLE) IMPLANT
ELECT REM PT RETURN 9FT ADLT (ELECTROSURGICAL) ×3
ELECTRODE REM PT RTRN 9FT ADLT (ELECTROSURGICAL) ×1 IMPLANT
FORMALIN 10 PREFIL 120ML (MISCELLANEOUS) ×3 IMPLANT
GLOVE SURG SS PI 7.5 STRL IVOR (GLOVE) ×3 IMPLANT
GOWN STRL REUS W/ TWL XL LVL3 (GOWN DISPOSABLE) ×1 IMPLANT
GOWN STRL REUS W/TWL LRG LVL3 (GOWN DISPOSABLE) ×6 IMPLANT
GOWN STRL REUS W/TWL XL LVL3 (GOWN DISPOSABLE) ×3
KIT ROOM TURNOVER APOR (KITS) ×3 IMPLANT
LIQUID BAND (GAUZE/BANDAGES/DRESSINGS) ×2 IMPLANT
MANIFOLD NEPTUNE II (INSTRUMENTS) ×3 IMPLANT
NS IRRIG 1000ML POUR BTL (IV SOLUTION) ×3 IMPLANT
PACK MINOR (CUSTOM PROCEDURE TRAY) IMPLANT
PAD ARMBOARD 7.5X6 YLW CONV (MISCELLANEOUS) ×3 IMPLANT
SET BASIN LINEN APH (SET/KITS/TRAYS/PACK) ×3 IMPLANT
SPONGE LAP 4X18 X RAY DECT (DISPOSABLE) ×2 IMPLANT
SUT ETHILON 3 0 FSL (SUTURE) IMPLANT
SUT PROLENE 4 0 PS 2 18 (SUTURE) IMPLANT
SUT VIC AB 3-0 SH 27 (SUTURE) ×6
SUT VIC AB 3-0 SH 27X BRD (SUTURE) IMPLANT
SUT VIC AB 4-0 PS2 27 (SUTURE) ×2 IMPLANT
SYRINGE 10CC LL (SYRINGE) ×3 IMPLANT

## 2015-05-15 NOTE — Anesthesia Preprocedure Evaluation (Signed)
Anesthesia Evaluation  Patient identified by MRN, date of birth, ID band Patient awake    Reviewed: Allergy & Precautions, NPO status , Patient's Chart, lab work & pertinent test results  Airway Mallampati: II  TM Distance: >3 FB     Dental  (+) Teeth Intact   Pulmonary neg pulmonary ROS,  breath sounds clear to auscultation        Cardiovascular negative cardio ROS  Rhythm:Regular Rate:Normal     Neuro/Psych    GI/Hepatic negative GI ROS,   Endo/Other    Renal/GU      Musculoskeletal   Abdominal   Peds  Hematology   Anesthesia Other Findings   Reproductive/Obstetrics                             Anesthesia Physical Anesthesia Plan  ASA: I  Anesthesia Plan: General   Post-op Pain Management:    Induction: Intravenous  Airway Management Planned: LMA  Additional Equipment:   Intra-op Plan:   Post-operative Plan: Extubation in OR  Informed Consent: I have reviewed the patients History and Physical, chart, labs and discussed the procedure including the risks, benefits and alternatives for the proposed anesthesia with the patient or authorized representative who has indicated his/her understanding and acceptance.     Plan Discussed with:   Anesthesia Plan Comments:         Anesthesia Quick Evaluation

## 2015-05-15 NOTE — Transfer of Care (Signed)
Immediate Anesthesia Transfer of Care Note  Patient: Cassandra Berry  Procedure(s) Performed: Procedure(s): EXCISION SOFT TISSUE NEOPLASM LEFT THIGH (Left)  Patient Location: PACU  Anesthesia Type:General  Level of Consciousness: awake, alert  and patient cooperative  Airway & Oxygen Therapy: Patient Spontanous Breathing  Post-op Assessment: Report given to RN, Post -op Vital signs reviewed and stable and Patient moving all extremities  Post vital signs: Reviewed and stable    Complications: No apparent anesthesia complications

## 2015-05-15 NOTE — Interval H&P Note (Signed)
History and Physical Interval Note:  05/15/2015 8:26 AM  Cassandra Berry  has presented today for surgery, with the diagnosis of soft tissue neoplasm  The various methods of treatment have been discussed with the patient and family. After consideration of risks, benefits and other options for treatment, the patient has consented to  Procedure(s): EXCISION SOFT TISSUE NEOPLASM OF THIGH - 5 CM (Left) as a surgical intervention .  The patient's history has been reviewed, patient examined, no change in status, stable for surgery.  I have reviewed the patient's chart and labs.  Questions were answered to the patient's satisfaction.     Aviva Signs A

## 2015-05-15 NOTE — Op Note (Signed)
Patient:  Cassandra Berry  DOB:  1951-05-19  MRN:  [id number]   Preop Diagnosis:  Soft tissue neoplasm, left thigh  Postop Diagnosis:  Same  Procedure:  Excision of soft tissue neoplasm, left thigh  Surgeon:  Aviva Signs, M.D.  Anes:  Gen.  Indications:  Patient is a 64 year old white female who presents with a tender subcutaneous soft tissue mass in the upper left thigh anteriorly. Risks and benefits of the procedure including bleeding, infection, and recurrence of the mass were fully explained to the patient, who gave informed consent.  Procedure note:  The patient is placed the supine position. After general anesthesia was administered, the left upper thigh was prepped and draped using usual sterile technique with DuraPrep. Surgical site confirmation was performed.  A transverse incision was made over the mass which measured approximately 4 cm in its greatest diameter. Dissection was taken down to the mass which was in the muscle. The fascia was incised and the mass was removed. Clear fluid was noted within the mass. The mass did not appear to be attached to the muscle. It was sent to pathology further examination. A bleeding was controlled using Bovie electrocautery. The fascia was reapproximated loosely using 3-0 Vicryl interrupted sutures. The subcutaneous layer was reapproximated using 3-0 Vicryl interrupted sutures. The skin was closed using a 4-0 Vicryl subcuticular suture. 0.5% Sensorcaine was instilled into the surrounding wound. Liquiband was applied.  All tape and needle counts were correct at the end of the procedure. Patient was awakened and transferred to PACU in stable condition.  Complications:  None  EBL:  Minimal  Specimen:  Neoplasm, soft tissue, left thigh

## 2015-05-15 NOTE — Discharge Instructions (Signed)
May shower tomorrow.  Use ice pack as needed for swelling.

## 2015-05-15 NOTE — Addendum Note (Signed)
Addendum  created 05/15/15 0945 by Vista Deck, CRNA   Modules edited: Anesthesia Events, Anesthesia Flowsheet, Anesthesia Responsible Staff, Narrator   Narrator:  Narrator: Event Log Edited

## 2015-05-15 NOTE — Progress Notes (Signed)
Awake. Denies pain. Ginger-ale given to drink.

## 2015-05-15 NOTE — Anesthesia Procedure Notes (Signed)
Procedure Name: LMA Insertion Date/Time: 05/15/2015 8:41 AM Performed by: Vista Deck Pre-anesthesia Checklist: Patient identified, Patient being monitored, Emergency Drugs available, Timeout performed and Suction available Patient Re-evaluated:Patient Re-evaluated prior to inductionOxygen Delivery Method: Circle System Utilized Preoxygenation: Pre-oxygenation with 100% oxygen Intubation Type: IV induction Ventilation: Mask ventilation without difficulty LMA: LMA inserted LMA Size: 4.0 Number of attempts: 1 Placement Confirmation: positive ETCO2 and breath sounds checked- equal and bilateral Tube secured with: Tape

## 2015-05-15 NOTE — Anesthesia Postprocedure Evaluation (Signed)
  Anesthesia Post-op Note  Patient: Cassandra Berry  Procedure(s) Performed: Procedure(s): EXCISION SOFT TISSUE NEOPLASM LEFT THIGH (Left)  Patient Location: PACU  Anesthesia Type:General  Level of Consciousness: awake and alert   Airway and Oxygen Therapy: Patient Spontanous Breathing  Post-op Pain: none  Post-op Assessment: Post-op Vital signs reviewed, Patient's Cardiovascular Status Stable, Respiratory Function Stable and Patent Airway              Post-op Vital Signs: Reviewed and stable  Last Vitals:  Filed Vitals:   05/15/15 0930  BP: 113/70  Pulse: 76  Temp:   Resp: 8    Complications: Patient re-intubated

## 2015-05-16 ENCOUNTER — Encounter (HOSPITAL_COMMUNITY): Payer: Self-pay | Admitting: General Surgery

## 2015-06-25 ENCOUNTER — Ambulatory Visit: Payer: Medicare Other | Admitting: Orthopedic Surgery

## 2015-10-22 ENCOUNTER — Telehealth: Payer: Self-pay | Admitting: Internal Medicine

## 2015-10-22 NOTE — Telephone Encounter (Signed)
PATIENT ON January RECALL FOR TCS

## 2015-10-22 NOTE — Telephone Encounter (Signed)
LETTER IN THE MAIL

## 2015-11-18 ENCOUNTER — Encounter (HOSPITAL_COMMUNITY): Admission: RE | Admit: 2015-11-18 | Payer: Medicare Other | Source: Ambulatory Visit

## 2015-11-22 ENCOUNTER — Encounter (HOSPITAL_COMMUNITY)
Admission: RE | Admit: 2015-11-22 | Discharge: 2015-11-22 | Disposition: A | Discharge status: Home / Self Care | Payer: Medicare Other | Source: Ambulatory Visit | Attending: Internal Medicine | Admitting: Internal Medicine

## 2015-11-22 ENCOUNTER — Encounter (HOSPITAL_COMMUNITY): Payer: Self-pay

## 2015-11-22 DIAGNOSIS — M810 Age-related osteoporosis without current pathological fracture: Secondary | ICD-10-CM | POA: Diagnosis present

## 2015-11-22 MED ORDER — ZOLEDRONIC ACID 5 MG/100ML IV SOLN
INTRAVENOUS | Status: AC
Start: 2015-11-22 — End: 2015-11-22
  Filled 2015-11-22: qty 100

## 2015-11-22 MED ORDER — ZOLEDRONIC ACID 5 MG/100ML IV SOLN
5.0000 mg | Freq: Once | INTRAVENOUS | Status: AC
  Administered 2015-11-22: 5 mg via INTRAVENOUS

## 2015-11-22 MED ORDER — SODIUM CHLORIDE 0.9 % IV SOLN
INTRAVENOUS | Status: DC
  Administered 2015-11-22: 13:00:00 via INTRAVENOUS

## 2016-08-12 ENCOUNTER — Other Ambulatory Visit (HOSPITAL_COMMUNITY): Payer: Self-pay | Admitting: Internal Medicine

## 2016-08-12 DIAGNOSIS — Z1231 Encounter for screening mammogram for malignant neoplasm of breast: Secondary | ICD-10-CM

## 2016-08-20 ENCOUNTER — Ambulatory Visit (HOSPITAL_COMMUNITY): Payer: Medicare Other

## 2016-08-27 ENCOUNTER — Ambulatory Visit (HOSPITAL_COMMUNITY)
Admission: RE | Admit: 2016-08-27 | Discharge: 2016-08-27 | Disposition: A | Discharge status: Home / Self Care | Payer: Medicare Other | Source: Ambulatory Visit | Attending: Internal Medicine | Admitting: Internal Medicine

## 2016-08-27 DIAGNOSIS — Z1231 Encounter for screening mammogram for malignant neoplasm of breast: Secondary | ICD-10-CM | POA: Diagnosis present

## 2016-10-07 ENCOUNTER — Ambulatory Visit (INDEPENDENT_AMBULATORY_CARE_PROVIDER_SITE_OTHER): Payer: Medicare Other | Admitting: Orthopaedic Surgery

## 2016-10-07 ENCOUNTER — Ambulatory Visit (INDEPENDENT_AMBULATORY_CARE_PROVIDER_SITE_OTHER): Payer: Medicare Other

## 2016-10-07 VITALS — BP 122/68 | HR 74 | Ht 62.0 in | Wt 122.0 lb

## 2016-10-07 DIAGNOSIS — G8929 Other chronic pain: Secondary | ICD-10-CM

## 2016-10-07 DIAGNOSIS — M25561 Pain in right knee: Secondary | ICD-10-CM | POA: Diagnosis not present

## 2016-10-07 NOTE — Progress Notes (Signed)
   Office Visit Note   Patient: Cassandra Berry           Date of Birth: Sep 24, 1951           MRN: [id number] Visit Date: 10/07/2016              Requested by: Celene Squibb, MD [address] 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 Cassandra Berry is happy with her present regimen of exercises and diclofenac. Discussed the potential implications of the arthritis and what she may expect over time. Not suggest any specific treatment above and beyond what she's presently doing at this point.  Follow-Up Instructions: No Follow-up on file.   Orders:  No orders of the defined types were placed in this encounter.  No orders of the defined types were placed in this encounter.     Procedures: No procedures performed   Clinical Data: No additional findings.   Subjective: Chief Complaint  Patient presents with  . Right Knee - Pain    Pt complaining of Right knee pain, denies injury. No swelling, numbness, tingling, no radiating pain, no LBP.  Cassandra Berry has had a chronic problem with her right knee. She has been diagnosed with osteoarthritis per Dr. Aline Brochure in [place]. She had an injection over a year ago that she did not feel made much of a difference. She has very little compromise of her activities. She does have an occasional swelling and popping. She does take diclofenac at night that he "helps".  Review of Systems   Objective: Vital Signs: There were no vitals taken for this visit.  Physical Exam  Ortho Exam right knee exam demonstrates minimal effusion. There is increased varus with mild medial joint pain. No patella crepitation or lateral joint discomfort. Full extension and flexion about 100. No popliteal pain or calf discomfort. No instability. No swelling distally neurovascular exam is  intact  Specialty Comments:  No specialty comments available.  Imaging: No results found.   PMFS History: Patient Active Problem List   Diagnosis Date Noted  . Osteoporosis 07/15/2012   Past Medical History:  Diagnosis Date  . History of gout   . History of kidney stones   . History of lumpectomy of right breast 1971  . Osteoporosis 07/15/2012    No family history on file.  Past Surgical History:  Procedure Laterality Date  . BREAST SURGERY Right    lumpectomy  . MASS EXCISION Left 05/15/2015   Procedure: EXCISION SOFT TISSUE NEOPLASM LEFT THIGH;  Surgeon: Aviva Signs, MD;  Location: [facility];  Service: General;  Laterality: Left;   Social History   Occupational History  . Not on file.   Social History Main Topics  . Smoking status: Never Smoker  . Smokeless tobacco: Not on file  . Alcohol use Yes     Comment: social  . Drug use: No  . Sexual activity: No

## 2016-11-23 ENCOUNTER — Encounter (HOSPITAL_COMMUNITY)
Admission: RE | Admit: 2016-11-23 | Discharge: 2016-11-23 | Disposition: A | Discharge status: Home / Self Care | Payer: Medicare Other | Source: Ambulatory Visit | Attending: Internal Medicine | Admitting: Internal Medicine

## 2016-11-23 ENCOUNTER — Encounter (HOSPITAL_COMMUNITY): Payer: Self-pay

## 2016-11-23 DIAGNOSIS — M810 Age-related osteoporosis without current pathological fracture: Secondary | ICD-10-CM | POA: Insufficient documentation

## 2016-11-23 MED ORDER — ZOLEDRONIC ACID 5 MG/100ML IV SOLN
5.0000 mg | Freq: Once | INTRAVENOUS | Status: AC
  Administered 2016-11-23: 5 mg via INTRAVENOUS

## 2016-11-23 MED ORDER — ZOLEDRONIC ACID 5 MG/100ML IV SOLN
INTRAVENOUS | Status: AC
  Filled 2016-11-23: qty 100

## 2016-11-23 MED ORDER — SODIUM CHLORIDE 0.9 % IV SOLN
INTRAVENOUS | Status: DC
  Administered 2016-11-23: 12:00:00 via INTRAVENOUS

## 2017-09-14 ENCOUNTER — Emergency Department (HOSPITAL_COMMUNITY)
Admission: EM | Admit: 2017-09-14 | Discharge: 2017-09-14 | Disposition: A | Discharge status: Home / Self Care | Payer: Medicare Other | Attending: Emergency Medicine | Admitting: Emergency Medicine

## 2017-09-14 ENCOUNTER — Emergency Department (HOSPITAL_COMMUNITY): Payer: Medicare Other

## 2017-09-14 DIAGNOSIS — Z79899 Other long term (current) drug therapy: Secondary | ICD-10-CM | POA: Diagnosis not present

## 2017-09-14 DIAGNOSIS — Z87442 Personal history of urinary calculi: Secondary | ICD-10-CM | POA: Insufficient documentation

## 2017-09-14 DIAGNOSIS — N132 Hydronephrosis with renal and ureteral calculous obstruction: Secondary | ICD-10-CM | POA: Diagnosis not present

## 2017-09-14 DIAGNOSIS — N200 Calculus of kidney: Secondary | ICD-10-CM

## 2017-09-14 DIAGNOSIS — M549 Dorsalgia, unspecified: Secondary | ICD-10-CM | POA: Diagnosis present

## 2017-09-14 LAB — URINALYSIS, ROUTINE W REFLEX MICROSCOPIC
BILIRUBIN URINE: NEGATIVE
Bacteria, UA: NONE SEEN
Glucose, UA: NEGATIVE mg/dL
KETONES UR: NEGATIVE mg/dL
NITRITE: NEGATIVE
PROTEIN: 30 mg/dL — AB
SPECIFIC GRAVITY, URINE: 1.019 (ref 1.005–1.030)
pH: 5 (ref 5.0–8.0)

## 2017-09-14 MED ORDER — KETOROLAC TROMETHAMINE 30 MG/ML IJ SOLN
30.0000 mg | Freq: Once | INTRAMUSCULAR | Status: AC
  Administered 2017-09-14: 30 mg via INTRAMUSCULAR
  Filled 2017-09-14: qty 1

## 2017-09-14 MED ORDER — ONDANSETRON HCL 4 MG PO TABS: 4.0000 mg | ORAL_TABLET | Freq: Three times a day (TID) | ORAL | 0 refills | Status: DC | PRN

## 2017-09-14 MED ORDER — TAMSULOSIN HCL 0.4 MG PO CAPS: 0.4000 mg | ORAL_CAPSULE | Freq: Every day | ORAL | 0 refills | Status: DC

## 2017-09-14 MED ORDER — OXYCODONE-ACETAMINOPHEN 5-325 MG PO TABS: 2.0000 | ORAL_TABLET | ORAL | 0 refills | Status: DC | PRN

## 2017-09-14 MED ORDER — TAMSULOSIN HCL 0.4 MG PO CAPS
0.4000 mg | ORAL_CAPSULE | Freq: Once | ORAL | Status: AC
  Administered 2017-09-14: 0.4 mg via ORAL
  Filled 2017-09-14: qty 1

## 2017-09-14 MED ORDER — IBUPROFEN 800 MG PO TABS: 800.0000 mg | ORAL_TABLET | Freq: Three times a day (TID) | ORAL | 0 refills | Status: DC

## 2017-09-14 MED ORDER — OXYCODONE-ACETAMINOPHEN 5-325 MG PO TABS: 1.0000 | ORAL_TABLET | ORAL | 0 refills | Status: DC | PRN

## 2017-09-14 NOTE — ED Provider Notes (Signed)
Emergency Department Provider Note   I have reviewed the triage vital signs and the nursing notes.   HISTORY  Chief Complaint Nephrolithiasis   HPI Cassandra Berry is a 66 y.o. female with a history of kidney stones and gout the presents with an acute onset of left-sided sharp and achy back pain about 30 minutes prior to arrival here similar to previous episodes of kidney stone.  After little bit of pain she started having nausea but no vomiting.  This was similar to her kidney stone 20 years ago so came here for further evaluation.  By the time I evaluated her the patient has no symptoms.  She did receive a Toradol shot prior to coming back.  She does have a rash noted on her chest and this is identified by her is being contact dermatitis and she is currently on prednisone for it.  The nausea has improved, no other GI symptoms.  No trauma.  No other associated modifying symptoms.  Has not tried anything for the pain this time says the Toradol given here.  Past Medical History:  Diagnosis Date  . History of gout   . History of kidney stones   . History of lumpectomy of right breast 1971  . Osteoporosis 07/15/2012    Patient Active Problem List   Diagnosis Date Noted  . Osteoporosis 07/15/2012    Past Surgical History:  Procedure Laterality Date  . BREAST SURGERY Right    lumpectomy    Current Outpatient Rx  . Order #: [id number] Class: Historical Med  . Order #: [id number] Class: Normal  . Order #: [id number] Class: Historical Med  . Order #: [id number] Class: Historical Med  . Order #: [id number] Class: Historical Med  . Order #: [id number] Class: Print  . Order #: [id number] Class: Historical Med  . Order #: [id number] Class: Historical Med  . Order #: [id number] Class: Print  . Order #: [id number] Class: Print  . Order #: [id number] Class: Print  . Order #: [id number] Class: Print  . Order #: [id number] Class: Print  . Order #: [id number] Class: Historical Med    Allergies Patient has no  known allergies.  No family history on file.  Social History Social History   Tobacco Use  . Smoking status: Never Smoker  Substance Use Topics  . Alcohol use: Yes    Comment: social  . Drug use: No    Review of Systems  All other systems negative except as documented in the HPI. All pertinent positives and negatives as reviewed in the HPI. ____________________________________________   PHYSICAL EXAM:  VITAL SIGNS: ED Triage Vitals  Enc Vitals Group     BP 09/14/17 1627 (!) 156/88     Pulse Rate 09/14/17 1627 82     Resp 09/14/17 1627 18     Temp 09/14/17 1627 98 F (36.7 C)     Temp Source 09/14/17 1627 Temporal     SpO2 09/14/17 1627 100 %     Weight 09/14/17 1628 110 lb (49.9 kg)     Height 09/14/17 1628 5\' 1"  (1.549 m)    Constitutional: Alert and oriented. Well appearing and in no acute distress. Eyes: Conjunctivae are normal. PERRL. EOMI. Head: Atraumatic. Nose: No congestion/rhinnorhea. Mouth/Throat: Mucous membranes are moist.  Oropharynx non-erythematous. Neck: No stridor.  No meningeal signs.   Cardiovascular: Normal rate, regular rhythm. Good peripheral circulation. Grossly normal heart sounds.   Respiratory: Normal respiratory effort.  No retractions. Lungs CTAB. Gastrointestinal: Soft and nontender. No distention.  Musculoskeletal: No lower extremity tenderness nor edema. No gross  deformities of extremities. Neurologic:  Normal speech and language. No gross focal neurologic deficits are appreciated.  Skin:  Skin is warm, dry and intact. No rash noted.   ____________________________________________   LABS (all labs ordered are listed, but only abnormal results are displayed)  Labs Reviewed  URINALYSIS, ROUTINE W REFLEX MICROSCOPIC - Abnormal; Notable for the following components:      Result Value   APPearance HAZY (*)    Hgb urine dipstick LARGE (*)    Protein, ur 30 (*)    Leukocytes, UA TRACE (*)    Squamous Epithelial / LPF 0-5 (*)     All other components within normal limits   ____________________________________________  RADIOLOGY  Ct Renal Stone Study  Result Date: 09/14/2017 CLINICAL DATA:  History of kidney stones.  Left-sided flank pain. EXAM: CT ABDOMEN AND PELVIS WITHOUT CONTRAST TECHNIQUE: Multidetector CT imaging of the abdomen and pelvis was performed following the standard protocol without IV contrast. COMPARISON:  03/29/2015 FINDINGS: Lower chest: No acute abnormality. Calcifications within the RCA coronary artery identified. Hepatobiliary: No focal liver abnormality is seen. No gallstones, gallbladder wall thickening, or biliary dilatation. Pancreas: Unremarkable. No pancreatic ductal dilatation or surrounding inflammatory changes. Spleen: Normal in size without focal abnormality. Adrenals/Urinary Tract: The adrenal glands are normal. 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 There is left-sided hydronephrosis. At the left UPJ there is a stone which measures 3 mm in diameter. The urinary bladder appears within normal limits. Stomach/Bowel: Stomach is within normal limits. Appendix is not confidently identified separate from the right lower quadrant bowel loops. No evidence of bowel wall thickening, distention, or inflammatory changes. Vascular/Lymphatic: Aortic atherosclerosis.  No aneurysm. Reproductive: Uterus and bilateral adnexa are unremarkable. Other: No abdominal wall hernia or abnormality. No abdominopelvic ascites. Musculoskeletal: Degenerative disc disease identified within the lumbar spine. No aggressive lytic or sclerotic bone lesions identified. IMPRESSION: 1. Left UPJ calculus is identified measuring 3 mm. This results in left-sided hydronephrosis. 2. Bilateral nephrolithiasis. 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 3. Aortic Atherosclerosis (ICD10-I70.0). Calcification in the RCA coronary artery noted. Electronically Signed   By: Kerby Moors M.D.   On:  09/14/2017 18:29    ____________________________________________   PROCEDURES  Procedure(s) performed:   Procedures   ____________________________________________   INITIAL IMPRESSION / ASSESSMENT AND PLAN / ED COURSE  Pertinent labs & imaging results that were available during my care of the patient were reviewed by me and considered in my medical decision making (see chart for details).  Kidney stone, pain controlled. Tolerating PO. Low suspicion for complication.   Will dc to fu w/ urology prn if symptoms return/not improving within 5-7 days. ____________________________________________  FINAL CLINICAL IMPRESSION(S) / ED DIAGNOSES  Final diagnoses:  Nephrolithiasis     MEDICATIONS GIVEN DURING THIS VISIT:  Medications  tamsulosin (FLOMAX) capsule 0.4 mg (not administered)  ketorolac (TORADOL) 30 MG/ML injection 30 mg (30 mg Intramuscular Given 09/14/17 1729)     NEW OUTPATIENT MEDICATIONS STARTED DURING THIS VISIT:  This SmartLink is deprecated. Use AVSMEDLIST instead to display the medication list for a patient.  Note:  This document was prepared using Dragon voice recognition software and may include unintentional dictation errors.   Merrily Pew, MD 09/14/17 [phone]

## 2017-09-14 NOTE — ED Triage Notes (Signed)
Pt states hx of kidney stone, approx 30 minutes ago began having L sided flank pain with N/. Denies hematuria, dysuria, or V/.

## 2017-09-22 MED FILL — Oxycodone w/ Acetaminophen Tab 5-325 MG: ORAL | Qty: 6 | Status: AC

## 2017-09-22 MED FILL — Ondansetron HCl Tab 4 MG: ORAL | Qty: 4 | Status: AC

## 2017-11-23 ENCOUNTER — Encounter (HOSPITAL_COMMUNITY)
Admission: RE | Admit: 2017-11-23 | Discharge: 2017-11-23 | Disposition: A | Discharge status: Home / Self Care | Payer: Medicare Other | Source: Ambulatory Visit | Attending: Internal Medicine | Admitting: Internal Medicine

## 2017-11-23 ENCOUNTER — Encounter (HOSPITAL_COMMUNITY): Payer: Self-pay

## 2017-11-23 DIAGNOSIS — M810 Age-related osteoporosis without current pathological fracture: Secondary | ICD-10-CM | POA: Insufficient documentation

## 2017-11-23 MED ORDER — ZOLEDRONIC ACID 5 MG/100ML IV SOLN
INTRAVENOUS | Status: AC
  Filled 2017-11-23: qty 100

## 2017-11-23 MED ORDER — ZOLEDRONIC ACID 5 MG/100ML IV SOLN
5.0000 mg | Freq: Once | INTRAVENOUS | Status: AC
  Administered 2017-11-23: 5 mg via INTRAVENOUS

## 2017-11-23 MED ORDER — SODIUM CHLORIDE 0.9 % IV SOLN
INTRAVENOUS | Status: DC
  Administered 2017-11-23: 250 mL via INTRAVENOUS

## 2017-11-23 NOTE — Discharge Instructions (Signed)

## 2017-12-01 ENCOUNTER — Other Ambulatory Visit (HOSPITAL_COMMUNITY): Payer: Self-pay | Admitting: Internal Medicine

## 2017-12-01 DIAGNOSIS — Z780 Asymptomatic menopausal state: Secondary | ICD-10-CM

## 2017-12-06 ENCOUNTER — Ambulatory Visit (HOSPITAL_COMMUNITY)
Admission: RE | Admit: 2017-12-06 | Discharge: 2017-12-06 | Disposition: A | Discharge status: Home / Self Care | Payer: Medicare Other | Source: Ambulatory Visit | Attending: Internal Medicine | Admitting: Internal Medicine

## 2017-12-06 DIAGNOSIS — M818 Other osteoporosis without current pathological fracture: Secondary | ICD-10-CM | POA: Insufficient documentation

## 2017-12-06 DIAGNOSIS — Z780 Asymptomatic menopausal state: Secondary | ICD-10-CM | POA: Insufficient documentation

## 2018-06-21 ENCOUNTER — Other Ambulatory Visit: Payer: Self-pay | Admitting: Internal Medicine

## 2018-06-21 DIAGNOSIS — Z780 Asymptomatic menopausal state: Secondary | ICD-10-CM

## 2018-06-21 DIAGNOSIS — M810 Age-related osteoporosis without current pathological fracture: Secondary | ICD-10-CM

## 2018-07-25 ENCOUNTER — Ambulatory Visit: Payer: Medicare Other

## 2018-08-02 ENCOUNTER — Ambulatory Visit (INDEPENDENT_AMBULATORY_CARE_PROVIDER_SITE_OTHER): Payer: Self-pay

## 2018-08-02 DIAGNOSIS — Z1211 Encounter for screening for malignant neoplasm of colon: Secondary | ICD-10-CM

## 2018-08-02 MED ORDER — NA SULFATE-K SULFATE-MG SULF 17.5-3.13-1.6 GM/177ML PO SOLN: 1.0000 | ORAL | 0 refills | Status: AC

## 2018-08-02 NOTE — Progress Notes (Signed)
Gastroenterology Pre-Procedure Review  Request Date:08/02/18 Requesting Physician: Inetta Fermo previous tcs 11/30/2005- RMR- no polyps  PATIENT REVIEW QUESTIONS: The patient responded to the following health history questions as indicated:    1. Diabetes Melitis: no 2. Joint replacements in the past 12 months: no 3. Major health problems in the past 3 months: no 4. Has an artificial valve or MVP: no 5. Has a defibrillator: no 6. Has been advised in past to take antibiotics in advance of a procedure like teeth cleaning: no 7. Family history of colon cancer: no  8. Alcohol Use: yes (occasionally)  9. History of sleep apnea: no  10. History of coronary artery or other vascular stents placed within the last 12 months: no 11. History of any prior anesthesia complications: no    MEDICATIONS & ALLERGIES:    Patient reports the following regarding taking any blood thinners:   Plavix? no Aspirin? no Coumadin? no Brilinta? no Xarelto? no Eliquis? no Pradaxa? no Savaysa? no Effient? no  Patient confirms/reports the following medications:  Current Outpatient Medications  Medication Sig Dispense Refill  . Calcium Carbonate-Vitamin D (CALCIUM + D PO) Take 1 tablet by mouth daily.    . Cholecalciferol (D-3-5) 5000 units capsule Take 5,000 Units by mouth daily.    . Magnesium 100 MG CAPS Take by mouth.    . RED YEAST RICE EXTRACT PO Take by mouth.    . TURMERIC PO Take 750 mg by mouth daily.    . Zoledronic Acid (RECLAST IV) Inject into the vein. Takes once a year     No current facility-administered medications for this visit.     Patient confirms/reports the following allergies:  No Known Allergies  No orders of the defined types were placed in this encounter.   AUTHORIZATION INFORMATION Primary Insurance: [place] ,  ID #: [id number] Pre-Cert / Josem Kaufmann required: no  SCHEDULE INFORMATION: Procedure has been scheduled as follows:  Date: 10/05/18, Time:10:30  Location: [facility]  Dr.Rourk  This Gastroenterology Pre-Precedure Review Form is being routed to the following provider(s): Walden Field NP

## 2018-08-02 NOTE — Patient Instructions (Addendum)
Cassandra Berry  10/05/1951 MRN: [id number]     Procedure Date: 11/30/18 Time to register: 8:30am Place to register: [facility] Procedure Time: 9:30am Scheduled provider: R. Garfield Cornea, MD    PREPARATION FOR COLONOSCOPY WITH SUPREP BOWEL PREP KIT  Note: Suprep Bowel Prep Kit is a split-dose (2day) regimen. Consumption of BOTH 6-ounce bottles is required for a complete prep.  Please notify us immediately if you are diabetic, take iron supplements, or if you are on Coumadin or any other blood thinners.                                                                                                                                                  2 DAYS BEFORE PROCEDURE:  DATE: 11/28/18  DAY: Monday Begin clear liquid diet AFTER your lunch meal. NO SOLID FOODS after this point.  1 DAY BEFORE PROCEDURE:  DATE: 11/29/18 DAY: Tuesday Continue clear liquids the entire day - NO SOLID FOOD.   At 6:00pm: Complete steps 1 through 4 below, using ONE (1) 6-ounce bottle, before going to bed. Step 1:  Pour ONE (1) 6-ounce bottle of SUPREP liquid into the mixing container.  Step 2:  Add cool drinking water to the 16 ounce line on the container and mix.  Note: Dilute the solution concentrate as directed prior to use. Step 3:  DRINK ALL the liquid in the container. Step 4:  You MUST drink an additional two (2) or more 16 ounce containers of water over the next one (1) hour.   Continue clear liquids.  DAY OF PROCEDURE:   DATE: 11/30/18  DAY: Wednesday If you take medications for your heart, blood pressure, or breathing, you may take these medications.   5 hours before your procedure at :4:30am Step 1:  Pour ONE (1) 6-ounce bottle of SUPREP liquid into the mixing container.  Step 2:  Add cool drinking water to the 16 ounce line on the container and mix.  Note: Dilute the solution concentrate as directed prior to use. Step 3:  DRINK ALL the liquid in the container. Step 4:  You MUST drink  an additional two (2) or more 16 ounce containers of water over the next one (1) hour. You MUST complete the final glass of water at least 3 hours before your colonoscopy.   Nothing by mouth past 6:30am  You may take your morning medications with sip of water unless we have instructed otherwise.    Please see below for Dietary Information.  CLEAR LIQUIDS INCLUDE:  Water Jello (NOT red in color)   Ice Popsicles (NOT red in color)   Tea (sugar ok, no milk/cream) Powdered fruit flavored drinks  Coffee (sugar ok, no milk/cream) Gatorade/ Lemonade/ Kool-Aid  (NOT red in color)   Juice: apple, white grape, white cranberry Soft drinks  Clear bullion, consomme, broth (  fat free beef/chicken/vegetable)  Carbonated beverages (any kind)  Strained chicken noodle soup Hard Candy   Remember: Clear liquids are liquids that will allow you to see your fingers on the other side of a clear glass. Be sure liquids are NOT red in color, and not cloudy, but CLEAR.  DO NOT EAT OR DRINK ANY OF THE FOLLOWING:  Dairy products of any kind   Cranberry juice Tomato juice / V8 juice   Grapefruit juice Orange juice     Red grape juice  Do not eat any solid foods, including such foods as: cereal, oatmeal, yogurt, fruits, vegetables, creamed soups, eggs, bread, crackers, pureed foods in a blender, etc.   HELPFUL HINTS FOR DRINKING PREP SOLUTION:   Make sure prep is extremely cold. Mix and refrigerate the the morning of the prep. You may also put in the freezer.   You may try mixing some Crystal Light or Country Time Lemonade if you prefer. Mix in small amounts; add more if necessary.  Try drinking through a straw  Rinse mouth with water or a mouthwash between glasses, to remove after-taste.  Try sipping on a cold beverage /ice/ popsicles between glasses of prep.  Place a piece of sugar-free hard candy in mouth between glasses.  If you become nauseated, try consuming smaller amounts, or stretch out the time  between glasses. Stop for 30-60 minutes, then slowly start back drinking.     OTHER INSTRUCTIONS  You will need a responsible adult at least 69 years of age to accompany you and drive you home. This person must remain in the waiting room during your procedure. The hospital will cancel your procedure if you do not have a responsible adult with you.   1. Wear loose fitting clothing that is easily removed. 2. Leave jewelry and other valuables at home.  3. Remove all body piercing jewelry and leave at home. 4. Total time from sign-in until discharge is approximately 2-3 hours. 5. You should go home directly after your procedure and rest. You can resume normal activities the day after your procedure. 6. The day of your procedure you should not:  Drive  Make legal decisions  Operate machinery  Drink alcohol  Return to work   You may call the office (Dept: [phone]) before 5:00pm, or page the doctor on call [phone]) after 5:00pm, for further instructions, if necessary.   Insurance Information YOU WILL NEED TO CHECK WITH YOUR INSURANCE COMPANY FOR THE BENEFITS OF COVERAGE YOU HAVE FOR THIS PROCEDURE.  UNFORTUNATELY, NOT ALL INSURANCE COMPANIES HAVE BENEFITS TO COVER ALL OR PART OF THESE TYPES OF PROCEDURES.  IT IS YOUR RESPONSIBILITY TO CHECK YOUR BENEFITS, HOWEVER, WE WILL BE GLAD TO ASSIST YOU WITH ANY CODES YOUR INSURANCE COMPANY MAY NEED.    PLEASE NOTE THAT MOST INSURANCE COMPANIES WILL NOT COVER A SCREENING COLONOSCOPY FOR PEOPLE UNDER THE AGE OF 50  IF YOU HAVE BCBS INSURANCE, YOU MAY HAVE BENEFITS FOR A SCREENING COLONOSCOPY BUT IF POLYPS ARE FOUND THE DIAGNOSIS WILL CHANGE AND THEN YOU MAY HAVE A DEDUCTIBLE THAT WILL NEED TO BE MET. SO PLEASE MAKE SURE YOU CHECK YOUR BENEFITS FOR A SCREENING COLONOSCOPY AS WELL AS A DIAGNOSTIC COLONOSCOPY.

## 2018-08-05 NOTE — Progress Notes (Signed)
Ok to schedule.

## 2018-08-19 ENCOUNTER — Other Ambulatory Visit (HOSPITAL_COMMUNITY): Payer: Self-pay | Admitting: Internal Medicine

## 2018-08-19 DIAGNOSIS — Z1231 Encounter for screening mammogram for malignant neoplasm of breast: Secondary | ICD-10-CM

## 2018-08-31 ENCOUNTER — Ambulatory Visit (HOSPITAL_COMMUNITY)
Admission: RE | Admit: 2018-08-31 | Discharge: 2018-08-31 | Disposition: A | Discharge status: Home / Self Care | Payer: Medicare Other | Source: Ambulatory Visit | Attending: Internal Medicine | Admitting: Internal Medicine

## 2018-08-31 DIAGNOSIS — Z1231 Encounter for screening mammogram for malignant neoplasm of breast: Secondary | ICD-10-CM | POA: Insufficient documentation

## 2018-09-15 ENCOUNTER — Telehealth: Payer: Self-pay | Admitting: Internal Medicine

## 2018-09-15 NOTE — Telephone Encounter (Signed)
Called endo-LM for Hoyle Sauer to move procedure.

## 2018-09-15 NOTE — Telephone Encounter (Signed)
Spoke with the pt and moved tcs date, mailed new instructions.

## 2018-09-15 NOTE — Telephone Encounter (Signed)
Pt is scheduled with [facility] on 12/4 for a colonoscopy and wants to reschedule. I told her that JL would call her this afternoon. [phone]

## 2018-09-15 NOTE — Progress Notes (Signed)
Pt called- needed to reschedule tcs, moved her to 11/30/18 and new instructions mailed to the pt- see phone note.

## 2018-11-23 DIAGNOSIS — E782 Mixed hyperlipidemia: Secondary | ICD-10-CM | POA: Diagnosis not present

## 2018-11-23 DIAGNOSIS — R7301 Impaired fasting glucose: Secondary | ICD-10-CM | POA: Diagnosis not present

## 2018-11-23 DIAGNOSIS — M810 Age-related osteoporosis without current pathological fracture: Secondary | ICD-10-CM | POA: Diagnosis not present

## 2018-11-24 ENCOUNTER — Encounter (HOSPITAL_COMMUNITY): Payer: Self-pay

## 2018-11-24 ENCOUNTER — Ambulatory Visit (HOSPITAL_COMMUNITY)
Admission: RE | Admit: 2018-11-24 | Discharge: 2018-11-24 | Disposition: A | Discharge status: Home / Self Care | Payer: PPO | Source: Ambulatory Visit | Attending: Internal Medicine | Admitting: Internal Medicine

## 2018-11-24 DIAGNOSIS — M810 Age-related osteoporosis without current pathological fracture: Secondary | ICD-10-CM | POA: Insufficient documentation

## 2018-11-24 MED ORDER — ZOLEDRONIC ACID 5 MG/100ML IV SOLN
5.0000 mg | Freq: Once | INTRAVENOUS | Status: AC
  Administered 2018-11-24: 5 mg via INTRAVENOUS

## 2018-11-24 MED ORDER — ZOLEDRONIC ACID 5 MG/100ML IV SOLN
INTRAVENOUS | Status: AC
  Filled 2018-11-24: qty 100

## 2018-11-24 MED ORDER — SODIUM CHLORIDE 0.9 % IV SOLN
Freq: Once | INTRAVENOUS | Status: AC
  Administered 2018-11-24: 250 mL via INTRAVENOUS

## 2018-11-24 NOTE — Discharge Instructions (Signed)

## 2018-11-29 NOTE — Addendum Note (Signed)
Addended by: Claudina Lick on: 11/29/2018 01:13 PM   Modules accepted: Orders, SmartSet

## 2018-11-29 NOTE — Progress Notes (Signed)
Cassandra Berry called- they cannot see the pts orders for this procedure. I have placed the orders again.

## 2018-11-30 ENCOUNTER — Encounter (HOSPITAL_COMMUNITY)
Admission: RE | Disposition: A | Discharge status: Home / Self Care | Payer: Self-pay | Source: Home / Self Care | Attending: Internal Medicine

## 2018-11-30 ENCOUNTER — Other Ambulatory Visit: Payer: Self-pay

## 2018-11-30 ENCOUNTER — Encounter (HOSPITAL_COMMUNITY): Payer: Self-pay | Admitting: *Deleted

## 2018-11-30 ENCOUNTER — Ambulatory Visit (HOSPITAL_COMMUNITY)
Admission: RE | Admit: 2018-11-30 | Discharge: 2018-11-30 | Disposition: A | Discharge status: Home / Self Care | Payer: PPO | Attending: Internal Medicine | Admitting: Internal Medicine

## 2018-11-30 DIAGNOSIS — M810 Age-related osteoporosis without current pathological fracture: Secondary | ICD-10-CM | POA: Insufficient documentation

## 2018-11-30 DIAGNOSIS — Z87442 Personal history of urinary calculi: Secondary | ICD-10-CM | POA: Diagnosis not present

## 2018-11-30 DIAGNOSIS — Z8249 Family history of ischemic heart disease and other diseases of the circulatory system: Secondary | ICD-10-CM | POA: Diagnosis not present

## 2018-11-30 DIAGNOSIS — M109 Gout, unspecified: Secondary | ICD-10-CM | POA: Diagnosis not present

## 2018-11-30 DIAGNOSIS — Z1211 Encounter for screening for malignant neoplasm of colon: Secondary | ICD-10-CM | POA: Diagnosis not present

## 2018-11-30 DIAGNOSIS — Z79899 Other long term (current) drug therapy: Secondary | ICD-10-CM | POA: Insufficient documentation

## 2018-11-30 HISTORY — PX: COLONOSCOPY: SHX5424

## 2018-11-30 SURGERY — COLONOSCOPY
Anesthesia: Moderate Sedation

## 2018-11-30 MED ORDER — ONDANSETRON HCL 4 MG/2ML IJ SOLN
INTRAMUSCULAR | Status: AC
  Filled 2018-11-30: qty 2

## 2018-11-30 MED ORDER — MIDAZOLAM HCL 5 MG/5ML IJ SOLN
INTRAMUSCULAR | Status: AC
  Filled 2018-11-30: qty 10

## 2018-11-30 MED ORDER — STERILE WATER FOR IRRIGATION IR SOLN
Status: DC | PRN
  Administered 2018-11-30: 09:00:00

## 2018-11-30 MED ORDER — MIDAZOLAM HCL 5 MG/5ML IJ SOLN
INTRAMUSCULAR | Status: DC | PRN
  Administered 2018-11-30: 1 mg via INTRAVENOUS
  Administered 2018-11-30: 2 mg via INTRAVENOUS
  Administered 2018-11-30: 1 mg via INTRAVENOUS

## 2018-11-30 MED ORDER — MEPERIDINE HCL 100 MG/ML IJ SOLN
INTRAMUSCULAR | Status: DC | PRN
  Administered 2018-11-30: 25 mg via INTRAVENOUS
  Administered 2018-11-30: 15 mg via INTRAVENOUS

## 2018-11-30 MED ORDER — SODIUM CHLORIDE 0.9 % IV SOLN
INTRAVENOUS | Status: DC
  Administered 2018-11-30: 1000 mL via INTRAVENOUS

## 2018-11-30 MED ORDER — MEPERIDINE HCL 50 MG/ML IJ SOLN
INTRAMUSCULAR | Status: AC
  Filled 2018-11-30: qty 1

## 2018-11-30 MED ORDER — ONDANSETRON HCL 4 MG/2ML IJ SOLN
INTRAMUSCULAR | Status: DC | PRN
  Administered 2018-11-30: 4 mg via INTRAVENOUS

## 2018-11-30 NOTE — Discharge Instructions (Signed)
. °  Colonoscopy Discharge Instructions  Read the instructions outlined below and refer to this sheet in the next few weeks. These discharge instructions provide you with general information on caring for yourself after you leave the hospital. Your doctor may also give you specific instructions. While your treatment has been planned according to the most current medical practices available, unavoidable complications occasionally occur. If you have any problems or questions after discharge, call Dr. Gala Romney at [phone]. ACTIVITY  You may resume your regular activity, but move at a slower pace for the next 24 hours.   Take frequent rest periods for the next 24 hours.   Walking will help get rid of the air and reduce the bloated feeling in your belly (abdomen).   No driving for 24 hours (because of the medicine (anesthesia) used during the test).    Do not sign any important legal documents or operate any machinery for 24 hours (because of the anesthesia used during the test).  NUTRITION  Drink plenty of fluids.   You may resume your normal diet as instructed by your doctor.   Begin with a light meal and progress to your normal diet. Heavy or fried foods are harder to digest and may make you feel sick to your stomach (nauseated).   Avoid alcoholic beverages for 24 hours or as instructed.  MEDICATIONS  You may resume your normal medications unless your doctor tells you otherwise.  WHAT YOU CAN EXPECT TODAY  Some feelings of bloating in the abdomen.   Passage of more gas than usual.   Spotting of blood in your stool or on the toilet paper.  IF YOU HAD POLYPS REMOVED DURING THE COLONOSCOPY:  No aspirin products for 7 days or as instructed.   No alcohol for 7 days or as instructed.   Eat a soft diet for the next 24 hours.  FINDING OUT THE RESULTS OF YOUR TEST Not all test results are available during your visit. If your test results are not back during the visit, make an appointment  with your caregiver to find out the results. Do not assume everything is normal if you have not heard from your caregiver or the medical facility. It is important for you to follow up on all of your test results.  SEEK IMMEDIATE MEDICAL ATTENTION IF:  You have more than a spotting of blood in your stool.   Your belly is swollen (abdominal distention).   You are nauseated or vomiting.   You have a temperature over 101.   You have abdominal pain or discomfort that is severe or gets worse throughout the day.   I do not recommend a future colonoscopy unless new symptoms develop.

## 2018-11-30 NOTE — Op Note (Signed)
[facility] Patient Name: Cassandra Berry Procedure Date: 11/30/2018 9:02 AM MRN: [id number] Date of Birth: 03-04-1951 Attending MD: Norvel Richards , MD CSN: [id number] Age: 68 Admit Type: Outpatient Procedure:                Colonoscopy Indications:              Screening for colorectal malignant neoplasm Providers:                Norvel Richards, MD, Janeece Riggers, RN, Nelma Rothman, Technician Referring MD:              Medicines:                Midazolam 4 mg IV, Meperidine 40 mg IV Complications:            No immediate complications. Estimated Blood Loss:     Estimated blood loss: none. Procedure:                Pre-Anesthesia Assessment:                           - Prior to the procedure, a History and Physical                            was performed, and patient medications and                            allergies were reviewed. The patient's tolerance of                            previous anesthesia was also reviewed. The risks                            and benefits of the procedure and the sedation                            options and risks were discussed with the patient.                            All questions were answered, and informed consent                            was obtained. Prior Anticoagulants: The patient has                            taken no previous anticoagulant or antiplatelet                            agents. ASA Grade Assessment: II - A patient with                            mild systemic disease. After reviewing the risks  and benefits, the patient was deemed in                            satisfactory condition to undergo the procedure.                           After obtaining informed consent, the colonoscope                            was passed under direct vision. Throughout the                            procedure, the patient's blood pressure, pulse, and     oxygen saturations were monitored continuously. The                            CF-[id number] ([id number]) scope was introduced through                            the anus and advanced to the 3 cm into the ileum.                            The colonoscopy was performed without difficulty.                            The patient tolerated the procedure well. The                            quality of the bowel preparation was adequate. Scope In: 9:25:18 AM Scope Out: 9:39:05 AM Scope Withdrawal Time: 0 hours 7 minutes 29 seconds  Total Procedure Duration: 0 hours 13 minutes 47 seconds  Findings:      The perianal and digital rectal examinations were normal.      The entire examined colon appeared normal on direct and retroflexion       views. Impression:               - The entire examined colon is normal on direct and                            retroflexion views.                           - No specimens collected. Moderate Sedation:      Moderate (conscious) sedation was administered by the endoscopy nurse       and supervised by the endoscopist. The following parameters were       monitored: oxygen saturation, heart rate, blood pressure, respiratory       rate, EKG, adequacy of pulmonary ventilation, and response to care.       Total physician intraservice time was 18 minutes. Recommendation:           - Patient has a contact number available for                            emergencies. The signs and symptoms of potential  delayed complications were discussed with the                            patient. Return to normal activities tomorrow.                            Written discharge instructions were provided to the                            patient.                           - Resume previous diet.                           - Continue present medications.                           - No repeat colonoscopy due to age.                           - Return to GI  clinic PRN. Procedure Code(s):        --- Professional ---                           [phone], Colonoscopy, flexible; diagnostic, including                            collection of specimen(s) by brushing or washing,                            when performed (separate procedure)                           G0500, Moderate sedation services provided by the                            same physician or other qualified health care                            professional performing a gastrointestinal                            endoscopic service that sedation supports,                            requiring the presence of an independent trained                            observer to assist in the monitoring of the                            patient's level of consciousness and physiological                            status; initial 15 minutes of intra-service  time;                            patient age 27 years or older (additional time may                            be reported with [phone], as appropriate) Diagnosis Code(s):        --- Professional ---                           Z12.11, Encounter for screening for malignant                            neoplasm of colon CPT copyright 2018 American Medical Association. All rights reserved. The codes documented in this report are preliminary and upon coder review may  be revised to meet current compliance requirements. Cristopher Estimable. Devron Cohick, MD Norvel Richards, MD 11/30/2018 9:49:00 AM This report has been signed electronically. Number of Addenda: 0

## 2018-11-30 NOTE — H&P (Signed)
_0 @   Primary Care Physician:  Celene Squibb, MD Primary Gastroenterologist:  Dr. Gala Romney  Pre-Procedure History & Physical: HPI:  Cassandra Berry is a 68 y.o. female here for screening colonoscopy.  No bowel symptoms.  No family history of colon cancer.  Negative colonoscopy 2007.  Past Medical History:  Diagnosis Date  . History of gout   . History of kidney stones   . History of lumpectomy of right breast 1971  . Osteoporosis 07/15/2012    Past Surgical History:  Procedure Laterality Date  . BREAST SURGERY Right    lumpectomy  . MASS EXCISION Left 05/15/2015   Procedure: EXCISION SOFT TISSUE NEOPLASM LEFT THIGH;  Surgeon: Aviva Signs, MD;  Location: [facility];  Service: General;  Laterality: Left;    Prior to Admission medications   Medication Sig Start Date End Date Taking? Authorizing Provider  Calcium Carb-Cholecalciferol (CALCIUM 600/VITAMIN D3 PO) Take 1 tablet by mouth daily.   Yes [provider]  Cholecalciferol (D-3-5) 5000 units capsule Take 5,000 Units by mouth daily.   Yes [provider]  Coenzyme Q10 (COQ10) 50 MG CAPS Take 50 mg by mouth daily.   Yes [provider]  Magnesium 250 MG TABS Take 250 mg by mouth daily.    Yes [provider]  Na Sulfate-K Sulfate-Mg Sulf (SUPREP BOWEL PREP KIT) 17.5-3.13-1.6 GM/177ML SOLN Take 1 kit by mouth as directed. 08/02/18  Yes Carlis Stable, NP  Red Yeast Rice Extract 600 MG CAPS Take 600 mg by mouth daily.    Yes [provider]  TURMERIC CURCUMIN PO Take 1 capsule by mouth daily.   Yes [provider]  Zoledronic Acid (RECLAST IV) Inject 1 Dose into the vein See admin instructions. Takes once a year    Yes [provider]    Allergies as of 08/02/2018  . (No Known Allergies)    Family History  Problem Relation Age of Onset  . Hypercholesterolemia Mother   . Heart Problems Father   . Hypertension Father   . Hypercholesterolemia Father     Social History    Socioeconomic History  . Marital status: Married    Spouse name: Not on file  . Number of children: Not on file  . Years of education: Not on file  . Highest education level: Not on file  Occupational History  . Not on file  Social Needs  . Financial resource strain: Not on file  . Food insecurity:    Worry: Not on file    Inability: Not on file  . Transportation needs:    Medical: Not on file    Non-medical: Not on file  Tobacco Use  . Smoking status: Never Smoker  . Smokeless tobacco: Never Used  Substance and Sexual Activity  . Alcohol use: Yes    Comment: social  . Drug use: No  . Sexual activity: Never    Birth control/protection: Post-menopausal  Lifestyle  . Physical activity:    Days per week: Not on file    Minutes per session: Not on file  . Stress: Not on file  Relationships  . Social connections:    Talks on phone: Not on file    Gets together: Not on file    Attends religious service: Not on file    Active member of club or organization: Not on file    Attends meetings of clubs or organizations: Not on file    Relationship status: Not on file  .  Intimate partner violence:    Fear of current or ex partner: Not on file    Emotionally abused: Not on file    Physically abused: Not on file    Forced sexual activity: Not on file  Other Topics Concern  . Not on file  Social History Narrative  . Not on file    Review of Systems: See HPI, otherwise negative ROS  Physical Exam: BP 120/63   Pulse 89   Temp (!) 97.3 F (36.3 C) (Oral)   Resp 12   Ht _0  (1.549 m)   Wt 49 kg   SpO2 100%   BMI 20.41 kg/m  General:   Alert,  Well-developed, well-nourished, pleasant and cooperative in NAD Neck:  Supple; no masses or thyromegaly. No significant cervical adenopathy. Lungs:  Clear throughout to auscultation.   No wheezes, crackles, or rhonchi. No acute distress. Heart:  Regular rate and rhythm; no murmurs, clicks, rubs,  or gallops. Abdomen:  Non-distended, normal bowel sounds.  Soft and nontender without appreciable mass or hepatosplenomegaly.  Pulses:  Normal pulses noted. Extremities:  Without clubbing or edema.  Impression/Plan: 68 year old lady here for average her screening colonoscopy.  The risks, benefits, limitations, alternatives and imponderables have been reviewed with the patient. Questions have been answered. All parties are agreeable.      Notice: This dictation was prepared with Dragon dictation along with smaller phrase technology. Any transcriptional errors that result from this process are unintentional and may not be corrected upon review.

## 2018-12-06 ENCOUNTER — Encounter (HOSPITAL_COMMUNITY): Payer: Self-pay | Admitting: Internal Medicine

## 2018-12-12 DIAGNOSIS — R7301 Impaired fasting glucose: Secondary | ICD-10-CM | POA: Diagnosis not present

## 2018-12-12 DIAGNOSIS — M810 Age-related osteoporosis without current pathological fracture: Secondary | ICD-10-CM | POA: Diagnosis not present

## 2018-12-12 DIAGNOSIS — M25569 Pain in unspecified knee: Secondary | ICD-10-CM | POA: Diagnosis not present

## 2018-12-12 DIAGNOSIS — R252 Cramp and spasm: Secondary | ICD-10-CM | POA: Diagnosis not present

## 2018-12-12 DIAGNOSIS — R1310 Dysphagia, unspecified: Secondary | ICD-10-CM | POA: Diagnosis not present

## 2018-12-12 DIAGNOSIS — E782 Mixed hyperlipidemia: Secondary | ICD-10-CM | POA: Diagnosis not present

## 2019-02-25 DIAGNOSIS — Z0000 Encounter for general adult medical examination without abnormal findings: Secondary | ICD-10-CM | POA: Diagnosis not present

## 2019-06-01 ENCOUNTER — Other Ambulatory Visit: Payer: Self-pay

## 2019-08-02 DIAGNOSIS — E782 Mixed hyperlipidemia: Secondary | ICD-10-CM | POA: Diagnosis not present

## 2019-08-02 DIAGNOSIS — R7301 Impaired fasting glucose: Secondary | ICD-10-CM | POA: Diagnosis not present

## 2019-08-09 DIAGNOSIS — M810 Age-related osteoporosis without current pathological fracture: Secondary | ICD-10-CM | POA: Diagnosis not present

## 2019-08-09 DIAGNOSIS — E782 Mixed hyperlipidemia: Secondary | ICD-10-CM | POA: Diagnosis not present

## 2019-08-09 DIAGNOSIS — Z23 Encounter for immunization: Secondary | ICD-10-CM | POA: Diagnosis not present

## 2019-08-09 DIAGNOSIS — R252 Cramp and spasm: Secondary | ICD-10-CM | POA: Diagnosis not present

## 2019-08-09 DIAGNOSIS — R944 Abnormal results of kidney function studies: Secondary | ICD-10-CM | POA: Diagnosis not present

## 2019-08-30 DIAGNOSIS — E782 Mixed hyperlipidemia: Secondary | ICD-10-CM | POA: Diagnosis not present

## 2019-08-30 DIAGNOSIS — Z23 Encounter for immunization: Secondary | ICD-10-CM | POA: Diagnosis not present

## 2019-08-30 DIAGNOSIS — D1739 Benign lipomatous neoplasm of skin and subcutaneous tissue of other sites: Secondary | ICD-10-CM | POA: Diagnosis not present

## 2019-08-30 DIAGNOSIS — M810 Age-related osteoporosis without current pathological fracture: Secondary | ICD-10-CM | POA: Diagnosis not present

## 2019-08-30 DIAGNOSIS — R1310 Dysphagia, unspecified: Secondary | ICD-10-CM | POA: Diagnosis not present

## 2019-08-30 DIAGNOSIS — Z0001 Encounter for general adult medical examination with abnormal findings: Secondary | ICD-10-CM | POA: Diagnosis not present

## 2019-08-30 DIAGNOSIS — Z0000 Encounter for general adult medical examination without abnormal findings: Secondary | ICD-10-CM | POA: Diagnosis not present

## 2019-08-30 DIAGNOSIS — Z681 Body mass index (BMI) 19 or less, adult: Secondary | ICD-10-CM | POA: Diagnosis not present

## 2019-08-30 DIAGNOSIS — G4762 Sleep related leg cramps: Secondary | ICD-10-CM | POA: Diagnosis not present

## 2019-08-30 DIAGNOSIS — M25569 Pain in unspecified knee: Secondary | ICD-10-CM | POA: Diagnosis not present

## 2019-08-30 DIAGNOSIS — R252 Cramp and spasm: Secondary | ICD-10-CM | POA: Diagnosis not present

## 2019-08-30 DIAGNOSIS — R7301 Impaired fasting glucose: Secondary | ICD-10-CM | POA: Diagnosis not present

## 2019-11-27 ENCOUNTER — Other Ambulatory Visit (HOSPITAL_COMMUNITY): Payer: Self-pay | Admitting: *Deleted

## 2019-11-28 ENCOUNTER — Encounter (HOSPITAL_COMMUNITY)
Admission: RE | Admit: 2019-11-28 | Discharge: 2019-11-28 | Disposition: A | Discharge status: Home / Self Care | Payer: PPO | Source: Ambulatory Visit | Attending: Internal Medicine | Admitting: Internal Medicine

## 2019-11-28 ENCOUNTER — Other Ambulatory Visit: Payer: Self-pay

## 2019-11-28 DIAGNOSIS — M810 Age-related osteoporosis without current pathological fracture: Secondary | ICD-10-CM | POA: Diagnosis not present

## 2019-11-28 MED ORDER — ZOLEDRONIC ACID 5 MG/100ML IV SOLN
5.0000 mg | Freq: Once | INTRAVENOUS | Status: AC
  Administered 2019-11-28: 5 mg via INTRAVENOUS

## 2019-11-28 MED ORDER — ZOLEDRONIC ACID 5 MG/100ML IV SOLN
INTRAVENOUS | Status: AC
  Filled 2019-11-28: qty 100

## 2019-11-28 MED ORDER — SODIUM CHLORIDE 0.9 % IV SOLN: INTRAVENOUS | Status: DC

## 2019-11-29 LAB — POCT I-STAT, CHEM 8
BUN: 26 mg/dL — ABNORMAL HIGH (ref 8–23)
Calcium, Ion: 1.27 mmol/L (ref 1.15–1.40)
Chloride: 102 mmol/L (ref 98–111)
Creatinine, Ser: 0.9 mg/dL (ref 0.44–1.00)
Glucose, Bld: 78 mg/dL (ref 70–99)
HCT: 39 % (ref 36.0–46.0)
Hemoglobin: 13.3 g/dL (ref 12.0–15.0)
Potassium: 4.4 mmol/L (ref 3.5–5.1)
Sodium: 140 mmol/L (ref 135–145)
TCO2: 30 mmol/L (ref 22–32)

## 2020-03-22 DIAGNOSIS — M9903 Segmental and somatic dysfunction of lumbar region: Secondary | ICD-10-CM | POA: Diagnosis not present

## 2020-03-22 DIAGNOSIS — M25561 Pain in right knee: Secondary | ICD-10-CM | POA: Diagnosis not present

## 2020-03-22 DIAGNOSIS — M545 Low back pain: Secondary | ICD-10-CM | POA: Diagnosis not present

## 2020-03-22 DIAGNOSIS — M9906 Segmental and somatic dysfunction of lower extremity: Secondary | ICD-10-CM | POA: Diagnosis not present

## 2020-08-09 DIAGNOSIS — M9903 Segmental and somatic dysfunction of lumbar region: Secondary | ICD-10-CM | POA: Diagnosis not present

## 2020-08-09 DIAGNOSIS — M9902 Segmental and somatic dysfunction of thoracic region: Secondary | ICD-10-CM | POA: Diagnosis not present

## 2020-08-09 DIAGNOSIS — M9901 Segmental and somatic dysfunction of cervical region: Secondary | ICD-10-CM | POA: Diagnosis not present

## 2020-08-09 DIAGNOSIS — M5186 Other intervertebral disc disorders, lumbar region: Secondary | ICD-10-CM | POA: Diagnosis not present

## 2020-08-13 DIAGNOSIS — M810 Age-related osteoporosis without current pathological fracture: Secondary | ICD-10-CM | POA: Diagnosis not present

## 2020-08-13 DIAGNOSIS — M25569 Pain in unspecified knee: Secondary | ICD-10-CM | POA: Diagnosis not present

## 2020-08-13 DIAGNOSIS — R1310 Dysphagia, unspecified: Secondary | ICD-10-CM | POA: Diagnosis not present

## 2020-08-13 DIAGNOSIS — G4762 Sleep related leg cramps: Secondary | ICD-10-CM | POA: Diagnosis not present

## 2020-08-13 DIAGNOSIS — Z23 Encounter for immunization: Secondary | ICD-10-CM | POA: Diagnosis not present

## 2020-08-13 DIAGNOSIS — Z0001 Encounter for general adult medical examination with abnormal findings: Secondary | ICD-10-CM | POA: Diagnosis not present

## 2020-08-13 DIAGNOSIS — D1739 Benign lipomatous neoplasm of skin and subcutaneous tissue of other sites: Secondary | ICD-10-CM | POA: Diagnosis not present

## 2020-08-13 DIAGNOSIS — E782 Mixed hyperlipidemia: Secondary | ICD-10-CM | POA: Diagnosis not present

## 2020-08-13 DIAGNOSIS — Z0000 Encounter for general adult medical examination without abnormal findings: Secondary | ICD-10-CM | POA: Diagnosis not present

## 2020-08-13 DIAGNOSIS — R944 Abnormal results of kidney function studies: Secondary | ICD-10-CM | POA: Diagnosis not present

## 2020-08-13 DIAGNOSIS — Z681 Body mass index (BMI) 19 or less, adult: Secondary | ICD-10-CM | POA: Diagnosis not present

## 2020-08-13 DIAGNOSIS — R7301 Impaired fasting glucose: Secondary | ICD-10-CM | POA: Diagnosis not present

## 2020-08-16 ENCOUNTER — Other Ambulatory Visit (HOSPITAL_COMMUNITY): Payer: Self-pay | Admitting: Internal Medicine

## 2020-08-16 DIAGNOSIS — E782 Mixed hyperlipidemia: Secondary | ICD-10-CM | POA: Diagnosis not present

## 2020-08-16 DIAGNOSIS — Z0001 Encounter for general adult medical examination with abnormal findings: Secondary | ICD-10-CM | POA: Diagnosis not present

## 2020-08-16 DIAGNOSIS — Z13820 Encounter for screening for osteoporosis: Secondary | ICD-10-CM

## 2020-08-16 DIAGNOSIS — Z23 Encounter for immunization: Secondary | ICD-10-CM | POA: Diagnosis not present

## 2020-08-16 DIAGNOSIS — M810 Age-related osteoporosis without current pathological fracture: Secondary | ICD-10-CM | POA: Diagnosis not present

## 2020-08-16 DIAGNOSIS — R252 Cramp and spasm: Secondary | ICD-10-CM | POA: Diagnosis not present

## 2020-08-16 DIAGNOSIS — Z0000 Encounter for general adult medical examination without abnormal findings: Secondary | ICD-10-CM | POA: Diagnosis not present

## 2020-11-08 DIAGNOSIS — E782 Mixed hyperlipidemia: Secondary | ICD-10-CM | POA: Diagnosis not present

## 2020-11-08 DIAGNOSIS — M25569 Pain in unspecified knee: Secondary | ICD-10-CM | POA: Diagnosis not present

## 2020-11-08 DIAGNOSIS — Z23 Encounter for immunization: Secondary | ICD-10-CM | POA: Diagnosis not present

## 2020-11-08 DIAGNOSIS — R7301 Impaired fasting glucose: Secondary | ICD-10-CM | POA: Diagnosis not present

## 2020-11-08 DIAGNOSIS — G4762 Sleep related leg cramps: Secondary | ICD-10-CM | POA: Diagnosis not present

## 2020-11-08 DIAGNOSIS — Z0001 Encounter for general adult medical examination with abnormal findings: Secondary | ICD-10-CM | POA: Diagnosis not present

## 2020-11-08 DIAGNOSIS — Z0000 Encounter for general adult medical examination without abnormal findings: Secondary | ICD-10-CM | POA: Diagnosis not present

## 2020-11-08 DIAGNOSIS — M810 Age-related osteoporosis without current pathological fracture: Secondary | ICD-10-CM | POA: Diagnosis not present

## 2020-11-08 DIAGNOSIS — Z681 Body mass index (BMI) 19 or less, adult: Secondary | ICD-10-CM | POA: Diagnosis not present

## 2020-11-08 DIAGNOSIS — D1739 Benign lipomatous neoplasm of skin and subcutaneous tissue of other sites: Secondary | ICD-10-CM | POA: Diagnosis not present

## 2020-11-08 DIAGNOSIS — R1310 Dysphagia, unspecified: Secondary | ICD-10-CM | POA: Diagnosis not present

## 2020-11-08 DIAGNOSIS — R944 Abnormal results of kidney function studies: Secondary | ICD-10-CM | POA: Diagnosis not present

## 2020-11-27 ENCOUNTER — Other Ambulatory Visit (HOSPITAL_COMMUNITY)
Admission: RE | Admit: 2020-11-27 | Discharge: 2020-11-27 | Disposition: A | Discharge status: Home / Self Care | Payer: PPO | Source: Ambulatory Visit | Attending: Internal Medicine | Admitting: Internal Medicine

## 2020-11-27 ENCOUNTER — Encounter (HOSPITAL_COMMUNITY): Payer: Self-pay

## 2020-11-27 ENCOUNTER — Other Ambulatory Visit: Payer: Self-pay

## 2020-11-27 DIAGNOSIS — M810 Age-related osteoporosis without current pathological fracture: Secondary | ICD-10-CM | POA: Diagnosis not present

## 2020-11-27 MED ORDER — ZOLEDRONIC ACID 5 MG/100ML IV SOLN
5.0000 mg | Freq: Once | INTRAVENOUS | Status: AC
  Administered 2020-11-27: 5 mg via INTRAVENOUS

## 2020-11-27 MED ORDER — SODIUM CHLORIDE 0.9 % IV SOLN: Freq: Once | INTRAVENOUS | Status: AC

## 2021-06-11 DIAGNOSIS — D485 Neoplasm of uncertain behavior of skin: Secondary | ICD-10-CM | POA: Diagnosis not present

## 2021-06-11 DIAGNOSIS — L821 Other seborrheic keratosis: Secondary | ICD-10-CM | POA: Diagnosis not present

## 2021-06-11 DIAGNOSIS — D2272 Melanocytic nevi of left lower limb, including hip: Secondary | ICD-10-CM | POA: Diagnosis not present

## 2021-06-11 DIAGNOSIS — D225 Melanocytic nevi of trunk: Secondary | ICD-10-CM | POA: Diagnosis not present

## 2021-06-11 DIAGNOSIS — Z1283 Encounter for screening for malignant neoplasm of skin: Secondary | ICD-10-CM | POA: Diagnosis not present

## 2021-07-02 ENCOUNTER — Ambulatory Visit
Admission: EM | Admit: 2021-07-02 | Discharge: 2021-07-02 | Disposition: A | Discharge status: Home / Self Care | Payer: PPO | Attending: Family Medicine | Admitting: Family Medicine

## 2021-07-02 ENCOUNTER — Other Ambulatory Visit: Payer: Self-pay

## 2021-07-02 ENCOUNTER — Encounter: Payer: Self-pay | Admitting: Emergency Medicine

## 2021-07-02 DIAGNOSIS — U071 COVID-19: Secondary | ICD-10-CM | POA: Diagnosis not present

## 2021-07-02 NOTE — ED Triage Notes (Signed)
Tested covid positive yesterday morning.  Chills, body aches, sore throat, cough, congestion since Sunday

## 2021-07-03 NOTE — ED Provider Notes (Signed)
  [place]   [id number] 07/02/21 Arrival Time: [place]:  1. COVID-19 virus infection    Discussed Paxlovid. Given good health and overall mild symptoms, she prefers to ride this out.    Follow-up Information     Celene Squibb, MD.   Specialty: Internal Medicine Why: As needed. Contact information: [facility] [address] [phone]                 Reviewed expectations re: course of current medical issues. Questions answered. Outlined signs and symptoms indicating need for more acute intervention. Understanding verbalized. After Visit Summary given.   SUBJECTIVE: History from: patient. Cassandra Berry is a 70 y.o. female who reports testing + for COVID yest am. Chills, body aches, ST, cough, congestion. Afebrile. Denies: difficulty breathing. Normal PO intake without n/v/d.   OBJECTIVE:  Vitals:   07/02/21 1929  BP: (!) 152/87  Pulse: 84  Resp: 16  Temp: 98.8 F (37.1 C)  TempSrc: Oral  SpO2: 97%    General appearance: alert; no distress Eyes: PERRLA; EOMI; conjunctiva normal HENT: ; AT; with nasal congestion Neck: supple  Lungs: speaks full sentences without difficulty; unlabored; coughing Extremities: no edema Skin: warm and dry Neurologic: normal gait Psychological: alert and cooperative; normal mood and affect  No Known Allergies  Past Medical History:  Diagnosis Date   History of gout    History of kidney stones    History of lumpectomy of right breast 1971   Osteoporosis 07/15/2012   Social History   Socioeconomic History   Marital status: Married    Spouse name: Not on file   Number of children: Not on file   Years of education: Not on file   Highest education level: Not on file  Occupational History   Not on file  Tobacco Use   Smoking status: Never   Smokeless tobacco: Never  Vaping Use   Vaping Use: Never used  Substance and Sexual Activity   Alcohol use: Yes    Comment:  social   Drug use: No   Sexual activity: Never    Birth control/protection: Post-menopausal  Other Topics Concern   Not on file  Social History Narrative   Not on file   Social Determinants of Health   Financial Resource Strain: Not on file  Food Insecurity: Not on file  Transportation Needs: Not on file  Physical Activity: Not on file  Stress: Not on file  Social Connections: Not on file  Intimate Partner Violence: Not on file   Family History  Problem Relation Age of Onset   Hypercholesterolemia Mother    Heart Problems Father    Hypertension Father    Hypercholesterolemia Father    Past Surgical History:  Procedure Laterality Date   BREAST SURGERY Right    lumpectomy   COLONOSCOPY N/A 11/30/2018   Procedure: COLONOSCOPY;  Surgeon: Daneil Dolin, MD;  Location: [facility];  Service: Endoscopy;  Laterality: N/A;  9:30   MASS EXCISION Left 05/15/2015   Procedure: EXCISION SOFT TISSUE NEOPLASM LEFT THIGH;  Surgeon: Aviva Signs, MD;  Location: [facility];  Service: General;  Laterality: Left;     Vanessa Kick, MD 07/03/21 1011

## 2021-08-28 DIAGNOSIS — E782 Mixed hyperlipidemia: Secondary | ICD-10-CM | POA: Diagnosis not present

## 2021-09-08 ENCOUNTER — Other Ambulatory Visit (HOSPITAL_COMMUNITY): Payer: Self-pay | Admitting: Family Medicine

## 2021-09-08 DIAGNOSIS — Z0001 Encounter for general adult medical examination with abnormal findings: Secondary | ICD-10-CM | POA: Diagnosis not present

## 2021-09-08 DIAGNOSIS — M810 Age-related osteoporosis without current pathological fracture: Secondary | ICD-10-CM | POA: Diagnosis not present

## 2021-09-08 DIAGNOSIS — E782 Mixed hyperlipidemia: Secondary | ICD-10-CM | POA: Diagnosis not present

## 2021-09-15 ENCOUNTER — Other Ambulatory Visit (HOSPITAL_COMMUNITY): Payer: Self-pay | Admitting: Internal Medicine

## 2021-09-15 ENCOUNTER — Ambulatory Visit (HOSPITAL_COMMUNITY)
Admission: RE | Admit: 2021-09-15 | Discharge: 2021-09-15 | Disposition: A | Discharge status: Home / Self Care | Payer: PPO | Source: Ambulatory Visit | Attending: Family Medicine | Admitting: Family Medicine

## 2021-09-15 ENCOUNTER — Other Ambulatory Visit: Payer: Self-pay

## 2021-09-15 ENCOUNTER — Ambulatory Visit (HOSPITAL_COMMUNITY)
Admission: RE | Admit: 2021-09-15 | Discharge: 2021-09-15 | Disposition: A | Discharge status: Home / Self Care | Payer: PPO | Source: Ambulatory Visit | Attending: Internal Medicine | Admitting: Internal Medicine

## 2021-09-15 DIAGNOSIS — M810 Age-related osteoporosis without current pathological fracture: Secondary | ICD-10-CM | POA: Insufficient documentation

## 2021-09-15 DIAGNOSIS — Z1231 Encounter for screening mammogram for malignant neoplasm of breast: Secondary | ICD-10-CM | POA: Insufficient documentation

## 2021-09-15 DIAGNOSIS — Z780 Asymptomatic menopausal state: Secondary | ICD-10-CM | POA: Diagnosis not present

## 2021-11-27 ENCOUNTER — Encounter (HOSPITAL_COMMUNITY): Payer: PPO

## 2021-12-15 DIAGNOSIS — D2271 Melanocytic nevi of right lower limb, including hip: Secondary | ICD-10-CM | POA: Diagnosis not present

## 2021-12-15 DIAGNOSIS — Z1283 Encounter for screening for malignant neoplasm of skin: Secondary | ICD-10-CM | POA: Diagnosis not present

## 2021-12-15 DIAGNOSIS — L820 Inflamed seborrheic keratosis: Secondary | ICD-10-CM | POA: Diagnosis not present

## 2021-12-15 DIAGNOSIS — D485 Neoplasm of uncertain behavior of skin: Secondary | ICD-10-CM | POA: Diagnosis not present

## 2021-12-15 DIAGNOSIS — D225 Melanocytic nevi of trunk: Secondary | ICD-10-CM | POA: Diagnosis not present

## 2021-12-29 DIAGNOSIS — L905 Scar conditions and fibrosis of skin: Secondary | ICD-10-CM | POA: Diagnosis not present

## 2021-12-29 DIAGNOSIS — D485 Neoplasm of uncertain behavior of skin: Secondary | ICD-10-CM | POA: Diagnosis not present

## 2022-01-14 DIAGNOSIS — M79674 Pain in right toe(s): Secondary | ICD-10-CM | POA: Diagnosis not present

## 2022-07-07 DIAGNOSIS — M272 Inflammatory conditions of jaws: Secondary | ICD-10-CM | POA: Diagnosis not present

## 2022-08-03 DIAGNOSIS — M9902 Segmental and somatic dysfunction of thoracic region: Secondary | ICD-10-CM | POA: Diagnosis not present

## 2022-08-03 DIAGNOSIS — M9903 Segmental and somatic dysfunction of lumbar region: Secondary | ICD-10-CM | POA: Diagnosis not present

## 2022-08-03 DIAGNOSIS — M62838 Other muscle spasm: Secondary | ICD-10-CM | POA: Diagnosis not present

## 2022-08-03 DIAGNOSIS — M9905 Segmental and somatic dysfunction of pelvic region: Secondary | ICD-10-CM | POA: Diagnosis not present

## 2022-08-03 DIAGNOSIS — M5186 Other intervertebral disc disorders, lumbar region: Secondary | ICD-10-CM | POA: Diagnosis not present

## 2022-09-07 DIAGNOSIS — E782 Mixed hyperlipidemia: Secondary | ICD-10-CM | POA: Diagnosis not present

## 2022-09-07 DIAGNOSIS — R7301 Impaired fasting glucose: Secondary | ICD-10-CM | POA: Diagnosis not present

## 2022-09-14 ENCOUNTER — Other Ambulatory Visit (HOSPITAL_COMMUNITY): Payer: Self-pay | Admitting: Family Medicine

## 2022-09-14 DIAGNOSIS — Z0000 Encounter for general adult medical examination without abnormal findings: Secondary | ICD-10-CM | POA: Diagnosis not present

## 2022-09-14 DIAGNOSIS — R0989 Other specified symptoms and signs involving the circulatory and respiratory systems: Secondary | ICD-10-CM

## 2022-09-14 DIAGNOSIS — M810 Age-related osteoporosis without current pathological fracture: Secondary | ICD-10-CM | POA: Diagnosis not present

## 2022-09-14 DIAGNOSIS — E782 Mixed hyperlipidemia: Secondary | ICD-10-CM | POA: Diagnosis not present

## 2022-09-15 ENCOUNTER — Other Ambulatory Visit (HOSPITAL_COMMUNITY): Payer: Self-pay | Admitting: Internal Medicine

## 2022-09-15 DIAGNOSIS — Z1231 Encounter for screening mammogram for malignant neoplasm of breast: Secondary | ICD-10-CM

## 2022-09-17 ENCOUNTER — Ambulatory Visit (HOSPITAL_COMMUNITY): Payer: PPO

## 2022-09-30 ENCOUNTER — Ambulatory Visit (HOSPITAL_COMMUNITY)
Admission: RE | Admit: 2022-09-30 | Discharge: 2022-09-30 | Disposition: A | Discharge status: Home / Self Care | Payer: PPO | Source: Ambulatory Visit | Attending: Family Medicine | Admitting: Family Medicine

## 2022-09-30 DIAGNOSIS — R0989 Other specified symptoms and signs involving the circulatory and respiratory systems: Secondary | ICD-10-CM | POA: Insufficient documentation

## 2022-10-01 ENCOUNTER — Ambulatory Visit (HOSPITAL_COMMUNITY)
Admission: RE | Admit: 2022-10-01 | Discharge: 2022-10-01 | Disposition: A | Discharge status: Home / Self Care | Payer: PPO | Source: Ambulatory Visit | Attending: Internal Medicine | Admitting: Internal Medicine

## 2022-10-01 DIAGNOSIS — Z1231 Encounter for screening mammogram for malignant neoplasm of breast: Secondary | ICD-10-CM | POA: Diagnosis not present

## 2022-11-30 DIAGNOSIS — M9905 Segmental and somatic dysfunction of pelvic region: Secondary | ICD-10-CM | POA: Diagnosis not present

## 2022-11-30 DIAGNOSIS — M25551 Pain in right hip: Secondary | ICD-10-CM | POA: Diagnosis not present

## 2022-11-30 DIAGNOSIS — M9902 Segmental and somatic dysfunction of thoracic region: Secondary | ICD-10-CM | POA: Diagnosis not present

## 2022-11-30 DIAGNOSIS — M62830 Muscle spasm of back: Secondary | ICD-10-CM | POA: Diagnosis not present

## 2022-11-30 DIAGNOSIS — M9903 Segmental and somatic dysfunction of lumbar region: Secondary | ICD-10-CM | POA: Diagnosis not present

## 2023-09-10 DIAGNOSIS — M25511 Pain in right shoulder: Secondary | ICD-10-CM | POA: Diagnosis not present

## 2023-09-17 DIAGNOSIS — E782 Mixed hyperlipidemia: Secondary | ICD-10-CM | POA: Diagnosis not present

## 2023-09-17 DIAGNOSIS — R7301 Impaired fasting glucose: Secondary | ICD-10-CM | POA: Diagnosis not present

## 2023-09-24 DIAGNOSIS — E782 Mixed hyperlipidemia: Secondary | ICD-10-CM | POA: Diagnosis not present

## 2023-09-24 DIAGNOSIS — Z1211 Encounter for screening for malignant neoplasm of colon: Secondary | ICD-10-CM | POA: Diagnosis not present

## 2023-09-24 DIAGNOSIS — Z0000 Encounter for general adult medical examination without abnormal findings: Secondary | ICD-10-CM | POA: Diagnosis not present

## 2023-09-24 DIAGNOSIS — M810 Age-related osteoporosis without current pathological fracture: Secondary | ICD-10-CM | POA: Diagnosis not present

## 2023-10-01 DIAGNOSIS — Z1211 Encounter for screening for malignant neoplasm of colon: Secondary | ICD-10-CM | POA: Diagnosis not present

## 2023-10-05 DIAGNOSIS — R252 Cramp and spasm: Secondary | ICD-10-CM | POA: Diagnosis not present

## 2023-10-05 DIAGNOSIS — K529 Noninfective gastroenteritis and colitis, unspecified: Secondary | ICD-10-CM | POA: Diagnosis not present

## 2023-10-05 DIAGNOSIS — R197 Diarrhea, unspecified: Secondary | ICD-10-CM | POA: Diagnosis not present

## 2023-10-05 DIAGNOSIS — Z713 Dietary counseling and surveillance: Secondary | ICD-10-CM | POA: Diagnosis not present

## 2023-10-07 LAB — COLOGUARD: COLOGUARD: NEGATIVE

## 2024-01-19 DIAGNOSIS — M722 Plantar fascial fibromatosis: Secondary | ICD-10-CM | POA: Diagnosis not present

## 2024-01-19 DIAGNOSIS — M9903 Segmental and somatic dysfunction of lumbar region: Secondary | ICD-10-CM | POA: Diagnosis not present

## 2024-01-19 DIAGNOSIS — M5441 Lumbago with sciatica, right side: Secondary | ICD-10-CM | POA: Diagnosis not present

## 2024-01-19 DIAGNOSIS — M9902 Segmental and somatic dysfunction of thoracic region: Secondary | ICD-10-CM | POA: Diagnosis not present

## 2024-01-19 DIAGNOSIS — M9905 Segmental and somatic dysfunction of pelvic region: Secondary | ICD-10-CM | POA: Diagnosis not present

## 2024-01-26 DIAGNOSIS — M9905 Segmental and somatic dysfunction of pelvic region: Secondary | ICD-10-CM | POA: Diagnosis not present

## 2024-01-26 DIAGNOSIS — M9903 Segmental and somatic dysfunction of lumbar region: Secondary | ICD-10-CM | POA: Diagnosis not present

## 2024-01-26 DIAGNOSIS — M9902 Segmental and somatic dysfunction of thoracic region: Secondary | ICD-10-CM | POA: Diagnosis not present

## 2024-01-26 DIAGNOSIS — M722 Plantar fascial fibromatosis: Secondary | ICD-10-CM | POA: Diagnosis not present

## 2024-01-26 DIAGNOSIS — M5441 Lumbago with sciatica, right side: Secondary | ICD-10-CM | POA: Diagnosis not present

## 2024-03-21 DIAGNOSIS — H04123 Dry eye syndrome of bilateral lacrimal glands: Secondary | ICD-10-CM | POA: Diagnosis not present

## 2024-07-19 DIAGNOSIS — M5441 Lumbago with sciatica, right side: Secondary | ICD-10-CM | POA: Diagnosis not present

## 2024-07-19 DIAGNOSIS — M9905 Segmental and somatic dysfunction of pelvic region: Secondary | ICD-10-CM | POA: Diagnosis not present

## 2024-07-19 DIAGNOSIS — M9902 Segmental and somatic dysfunction of thoracic region: Secondary | ICD-10-CM | POA: Diagnosis not present

## 2024-07-19 DIAGNOSIS — M9903 Segmental and somatic dysfunction of lumbar region: Secondary | ICD-10-CM | POA: Diagnosis not present

## 2024-07-26 DIAGNOSIS — M9902 Segmental and somatic dysfunction of thoracic region: Secondary | ICD-10-CM | POA: Diagnosis not present

## 2024-07-26 DIAGNOSIS — M9905 Segmental and somatic dysfunction of pelvic region: Secondary | ICD-10-CM | POA: Diagnosis not present

## 2024-07-26 DIAGNOSIS — M5441 Lumbago with sciatica, right side: Secondary | ICD-10-CM | POA: Diagnosis not present

## 2024-07-26 DIAGNOSIS — M9903 Segmental and somatic dysfunction of lumbar region: Secondary | ICD-10-CM | POA: Diagnosis not present

## 2024-08-04 DIAGNOSIS — M5441 Lumbago with sciatica, right side: Secondary | ICD-10-CM | POA: Diagnosis not present

## 2024-08-04 DIAGNOSIS — M9903 Segmental and somatic dysfunction of lumbar region: Secondary | ICD-10-CM | POA: Diagnosis not present

## 2024-08-04 DIAGNOSIS — M9905 Segmental and somatic dysfunction of pelvic region: Secondary | ICD-10-CM | POA: Diagnosis not present

## 2024-08-04 DIAGNOSIS — M9902 Segmental and somatic dysfunction of thoracic region: Secondary | ICD-10-CM | POA: Diagnosis not present

## 2024-08-08 DIAGNOSIS — D225 Melanocytic nevi of trunk: Secondary | ICD-10-CM | POA: Diagnosis not present

## 2024-08-08 DIAGNOSIS — Z1283 Encounter for screening for malignant neoplasm of skin: Secondary | ICD-10-CM | POA: Diagnosis not present

## 2024-08-08 DIAGNOSIS — M25551 Pain in right hip: Secondary | ICD-10-CM | POA: Diagnosis not present

## 2024-08-08 DIAGNOSIS — D485 Neoplasm of uncertain behavior of skin: Secondary | ICD-10-CM | POA: Diagnosis not present

## 2024-08-08 DIAGNOSIS — M545 Low back pain, unspecified: Secondary | ICD-10-CM | POA: Diagnosis not present

## 2024-08-08 DIAGNOSIS — L821 Other seborrheic keratosis: Secondary | ICD-10-CM | POA: Diagnosis not present

## 2024-08-08 DIAGNOSIS — D2271 Melanocytic nevi of right lower limb, including hip: Secondary | ICD-10-CM | POA: Diagnosis not present

## 2024-08-23 DIAGNOSIS — M25551 Pain in right hip: Secondary | ICD-10-CM | POA: Diagnosis not present

## 2024-09-04 DIAGNOSIS — M79672 Pain in left foot: Secondary | ICD-10-CM | POA: Diagnosis not present

## 2024-09-18 DIAGNOSIS — R7301 Impaired fasting glucose: Secondary | ICD-10-CM | POA: Diagnosis not present

## 2024-09-18 DIAGNOSIS — E782 Mixed hyperlipidemia: Secondary | ICD-10-CM | POA: Diagnosis not present

## 2024-09-26 ENCOUNTER — Other Ambulatory Visit (HOSPITAL_COMMUNITY): Payer: Self-pay | Admitting: Family Medicine

## 2024-09-26 DIAGNOSIS — M810 Age-related osteoporosis without current pathological fracture: Secondary | ICD-10-CM | POA: Diagnosis not present

## 2024-09-26 DIAGNOSIS — Z0000 Encounter for general adult medical examination without abnormal findings: Secondary | ICD-10-CM | POA: Diagnosis not present

## 2024-09-26 DIAGNOSIS — Z23 Encounter for immunization: Secondary | ICD-10-CM | POA: Diagnosis not present

## 2024-09-26 DIAGNOSIS — E782 Mixed hyperlipidemia: Secondary | ICD-10-CM | POA: Diagnosis not present

## 2024-10-02 ENCOUNTER — Other Ambulatory Visit (HOSPITAL_COMMUNITY): Payer: Self-pay | Admitting: Internal Medicine

## 2024-10-02 DIAGNOSIS — Z1231 Encounter for screening mammogram for malignant neoplasm of breast: Secondary | ICD-10-CM

## 2024-10-11 ENCOUNTER — Encounter (HOSPITAL_COMMUNITY): Payer: Self-pay

## 2024-10-11 ENCOUNTER — Inpatient Hospital Stay (HOSPITAL_COMMUNITY): Admission: RE | Admit: 2024-10-11 | Discharge: 2024-10-11 | Attending: Internal Medicine | Admitting: Internal Medicine

## 2024-10-11 ENCOUNTER — Inpatient Hospital Stay (HOSPITAL_COMMUNITY): Admission: RE | Admit: 2024-10-11 | Discharge: 2024-10-11 | Attending: Family Medicine | Admitting: Family Medicine

## 2024-10-11 DIAGNOSIS — Z1231 Encounter for screening mammogram for malignant neoplasm of breast: Secondary | ICD-10-CM

## 2024-10-11 DIAGNOSIS — M810 Age-related osteoporosis without current pathological fracture: Secondary | ICD-10-CM | POA: Diagnosis not present
